# Patient Record
Sex: Female | Born: 1955 | Race: White | Hispanic: No | Marital: Married | Smoking: Current every day smoker
Health system: Southern US, Community
[De-identification: ages and names within clinical notes are randomized; demographics above are authoritative.]

## PROBLEM LIST (undated history)

## (undated) DIAGNOSIS — M159 Polyosteoarthritis, unspecified: Secondary | ICD-10-CM

## (undated) DIAGNOSIS — M10379 Gout due to renal impairment, unspecified ankle and foot: Secondary | ICD-10-CM

## (undated) DIAGNOSIS — E1122 Type 2 diabetes mellitus with diabetic chronic kidney disease: Secondary | ICD-10-CM

## (undated) DIAGNOSIS — G8929 Other chronic pain: Secondary | ICD-10-CM

## (undated) DIAGNOSIS — N182 Chronic kidney disease, stage 2 (mild): Secondary | ICD-10-CM

## (undated) DIAGNOSIS — F1990 Other psychoactive substance use, unspecified, uncomplicated: Secondary | ICD-10-CM

## (undated) DIAGNOSIS — I10 Essential (primary) hypertension: Secondary | ICD-10-CM

## (undated) DIAGNOSIS — F3181 Bipolar II disorder: Secondary | ICD-10-CM

## (undated) DIAGNOSIS — E785 Hyperlipidemia, unspecified: Secondary | ICD-10-CM

## (undated) DIAGNOSIS — M10271 Drug-induced gout, right ankle and foot: Secondary | ICD-10-CM

## (undated) DIAGNOSIS — M545 Low back pain, unspecified: Secondary | ICD-10-CM

## (undated) HISTORY — DX: Bipolar II disorder: F31.81

## (undated) HISTORY — DX: Drug-induced gout, right ankle and foot: M10.271

## (undated) HISTORY — DX: Gout due to renal impairment, unspecified ankle and foot: M10.379

## (undated) HISTORY — DX: Polyosteoarthritis, unspecified: M15.9

## (undated) HISTORY — DX: Chronic kidney disease, stage 2 (mild): N18.2

## (undated) HISTORY — DX: Essential (primary) hypertension: I10

## (undated) HISTORY — DX: Other psychoactive substance use, unspecified, uncomplicated: F19.90

## (undated) HISTORY — DX: Type 2 diabetes mellitus with diabetic chronic kidney disease: E11.22

## (undated) HISTORY — DX: Hyperlipidemia, unspecified: E78.5

## (undated) HISTORY — DX: Other chronic pain: G89.29

## (undated) HISTORY — DX: Low back pain, unspecified: M54.50

## (undated) HISTORY — DX: Low back pain: M54.5

---

## 1977-08-10 HISTORY — PX: ABDOMINAL HYSTERECTOMY: SHX81

## 1977-08-10 HISTORY — PX: OOPHORECTOMY: SHX86

## 1984-08-10 HISTORY — PX: BREAST SURGERY: SHX581

## 1998-03-20 ENCOUNTER — Inpatient Hospital Stay (HOSPITAL_COMMUNITY): Admission: EM | Admit: 1998-03-20 | Discharge: 1998-03-20 | Payer: Self-pay | Admitting: *Deleted

## 1998-04-01 ENCOUNTER — Emergency Department (HOSPITAL_COMMUNITY): Admission: EM | Admit: 1998-04-01 | Discharge: 1998-04-01 | Payer: Self-pay | Admitting: Emergency Medicine

## 1998-06-09 ENCOUNTER — Inpatient Hospital Stay (HOSPITAL_COMMUNITY): Admission: EM | Admit: 1998-06-09 | Discharge: 1998-06-12 | Payer: Self-pay | Admitting: Emergency Medicine

## 1998-06-09 ENCOUNTER — Emergency Department (HOSPITAL_COMMUNITY): Admission: EM | Admit: 1998-06-09 | Discharge: 1998-06-09 | Payer: Self-pay | Admitting: Emergency Medicine

## 1998-07-21 ENCOUNTER — Emergency Department (HOSPITAL_COMMUNITY): Admission: EM | Admit: 1998-07-21 | Discharge: 1998-07-21 | Payer: Self-pay | Admitting: Emergency Medicine

## 1998-10-12 ENCOUNTER — Emergency Department (HOSPITAL_COMMUNITY): Admission: EM | Admit: 1998-10-12 | Discharge: 1998-10-12 | Payer: Self-pay | Admitting: Emergency Medicine

## 1998-10-13 ENCOUNTER — Encounter: Payer: Self-pay | Admitting: Emergency Medicine

## 1998-11-11 ENCOUNTER — Encounter (HOSPITAL_COMMUNITY): Admission: RE | Admit: 1998-11-11 | Discharge: 1999-02-09 | Payer: Self-pay | Admitting: Psychiatry

## 1999-07-05 ENCOUNTER — Inpatient Hospital Stay (HOSPITAL_COMMUNITY): Admission: EM | Admit: 1999-07-05 | Discharge: 1999-07-06 | Payer: Self-pay | Admitting: Specialist

## 1999-07-05 ENCOUNTER — Inpatient Hospital Stay (HOSPITAL_COMMUNITY): Admission: EM | Admit: 1999-07-05 | Discharge: 1999-07-05 | Payer: Self-pay | Admitting: *Deleted

## 2000-08-06 ENCOUNTER — Other Ambulatory Visit (HOSPITAL_COMMUNITY): Admission: RE | Admit: 2000-08-06 | Discharge: 2000-09-06 | Payer: Self-pay | Admitting: Psychiatry

## 2000-10-16 ENCOUNTER — Emergency Department (HOSPITAL_COMMUNITY): Admission: EM | Admit: 2000-10-16 | Discharge: 2000-10-16 | Payer: Self-pay | Admitting: Internal Medicine

## 2003-10-07 ENCOUNTER — Emergency Department (HOSPITAL_COMMUNITY): Admission: EM | Admit: 2003-10-07 | Discharge: 2003-10-07 | Payer: Self-pay | Admitting: Emergency Medicine

## 2004-09-25 ENCOUNTER — Emergency Department (HOSPITAL_COMMUNITY): Admission: EM | Admit: 2004-09-25 | Discharge: 2004-09-25 | Payer: Self-pay | Admitting: Emergency Medicine

## 2005-04-23 ENCOUNTER — Other Ambulatory Visit (HOSPITAL_COMMUNITY): Admission: RE | Admit: 2005-04-23 | Discharge: 2005-05-29 | Payer: Self-pay | Admitting: Psychiatry

## 2005-04-23 ENCOUNTER — Ambulatory Visit: Payer: Self-pay | Admitting: Psychiatry

## 2006-08-09 ENCOUNTER — Inpatient Hospital Stay (HOSPITAL_COMMUNITY): Admission: AD | Admit: 2006-08-09 | Discharge: 2006-08-12 | Payer: Self-pay | Admitting: Psychiatry

## 2006-08-09 ENCOUNTER — Ambulatory Visit: Payer: Self-pay | Admitting: Psychiatry

## 2007-03-06 ENCOUNTER — Emergency Department (HOSPITAL_COMMUNITY): Admission: EM | Admit: 2007-03-06 | Discharge: 2007-03-06 | Payer: Self-pay | Admitting: Emergency Medicine

## 2007-03-11 ENCOUNTER — Encounter: Admission: RE | Admit: 2007-03-11 | Discharge: 2007-03-11 | Payer: Self-pay | Admitting: Internal Medicine

## 2007-03-16 ENCOUNTER — Encounter: Admission: RE | Admit: 2007-03-16 | Discharge: 2007-03-16 | Payer: Self-pay | Admitting: Internal Medicine

## 2007-03-16 ENCOUNTER — Encounter (INDEPENDENT_AMBULATORY_CARE_PROVIDER_SITE_OTHER): Payer: Self-pay | Admitting: Interventional Radiology

## 2007-03-16 ENCOUNTER — Other Ambulatory Visit: Admission: RE | Admit: 2007-03-16 | Discharge: 2007-03-16 | Payer: Self-pay | Admitting: Interventional Radiology

## 2010-11-24 ENCOUNTER — Other Ambulatory Visit: Payer: Self-pay | Admitting: Emergency Medicine

## 2010-11-24 DIAGNOSIS — Z01419 Encounter for gynecological examination (general) (routine) without abnormal findings: Secondary | ICD-10-CM | POA: Insufficient documentation

## 2010-11-24 LAB — HM PAP SMEAR: HM Pap smear: NORMAL

## 2010-11-26 ENCOUNTER — Other Ambulatory Visit (HOSPITAL_COMMUNITY)
Admission: RE | Admit: 2010-11-26 | Discharge: 2010-11-26 | Disposition: A | Discharge status: Home / Self Care | Payer: Medicare HMO | Source: Ambulatory Visit | Attending: Internal Medicine | Admitting: Internal Medicine
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
## 2013-07-29 ENCOUNTER — Other Ambulatory Visit: Payer: Self-pay | Admitting: Internal Medicine

## 2013-08-10 DIAGNOSIS — M545 Low back pain: Secondary | ICD-10-CM

## 2013-08-10 DIAGNOSIS — I10 Essential (primary) hypertension: Secondary | ICD-10-CM | POA: Insufficient documentation

## 2013-08-10 DIAGNOSIS — E119 Type 2 diabetes mellitus without complications: Secondary | ICD-10-CM | POA: Insufficient documentation

## 2013-08-10 DIAGNOSIS — G8929 Other chronic pain: Secondary | ICD-10-CM | POA: Insufficient documentation

## 2013-08-10 DIAGNOSIS — E785 Hyperlipidemia, unspecified: Secondary | ICD-10-CM | POA: Insufficient documentation

## 2013-08-10 DIAGNOSIS — M1990 Unspecified osteoarthritis, unspecified site: Secondary | ICD-10-CM | POA: Insufficient documentation

## 2013-08-14 ENCOUNTER — Ambulatory Visit: Payer: Self-pay | Admitting: Emergency Medicine

## 2013-08-30 ENCOUNTER — Encounter (HOSPITAL_COMMUNITY): Payer: Self-pay | Admitting: Emergency Medicine

## 2013-08-30 ENCOUNTER — Emergency Department (HOSPITAL_COMMUNITY)
Admission: EM | Admit: 2013-08-30 | Discharge: 2013-08-30 | Disposition: A | Discharge status: Home / Self Care | Payer: Medicare HMO | Attending: Emergency Medicine | Admitting: Emergency Medicine

## 2013-08-30 ENCOUNTER — Emergency Department (HOSPITAL_COMMUNITY): Payer: Medicare HMO

## 2013-08-30 DIAGNOSIS — J329 Chronic sinusitis, unspecified: Secondary | ICD-10-CM | POA: Insufficient documentation

## 2013-08-30 DIAGNOSIS — F17200 Nicotine dependence, unspecified, uncomplicated: Secondary | ICD-10-CM | POA: Insufficient documentation

## 2013-08-30 DIAGNOSIS — J40 Bronchitis, not specified as acute or chronic: Secondary | ICD-10-CM

## 2013-08-30 DIAGNOSIS — Z720 Tobacco use: Secondary | ICD-10-CM

## 2013-08-30 DIAGNOSIS — I10 Essential (primary) hypertension: Secondary | ICD-10-CM | POA: Insufficient documentation

## 2013-08-30 DIAGNOSIS — Z79899 Other long term (current) drug therapy: Secondary | ICD-10-CM | POA: Insufficient documentation

## 2013-08-30 DIAGNOSIS — J209 Acute bronchitis, unspecified: Secondary | ICD-10-CM | POA: Insufficient documentation

## 2013-08-30 DIAGNOSIS — E785 Hyperlipidemia, unspecified: Secondary | ICD-10-CM | POA: Insufficient documentation

## 2013-08-30 DIAGNOSIS — R000 Tachycardia, unspecified: Secondary | ICD-10-CM | POA: Insufficient documentation

## 2013-08-30 DIAGNOSIS — Z7982 Long term (current) use of aspirin: Secondary | ICD-10-CM | POA: Insufficient documentation

## 2013-08-30 DIAGNOSIS — M129 Arthropathy, unspecified: Secondary | ICD-10-CM | POA: Insufficient documentation

## 2013-08-30 DIAGNOSIS — G8929 Other chronic pain: Secondary | ICD-10-CM | POA: Insufficient documentation

## 2013-08-30 DIAGNOSIS — E119 Type 2 diabetes mellitus without complications: Secondary | ICD-10-CM | POA: Insufficient documentation

## 2013-08-30 MED ORDER — PREDNISONE 20 MG PO TABS: 20.0000 mg | ORAL_TABLET | Freq: Two times a day (BID) | ORAL | Status: DC

## 2013-08-30 MED ORDER — LEVOFLOXACIN 500 MG PO TABS
750.0000 mg | ORAL_TABLET | Freq: Every day | ORAL | Status: DC
  Administered 2013-08-30: 750 mg via ORAL
  Filled 2013-08-30: qty 2

## 2013-08-30 MED ORDER — ALBUTEROL SULFATE HFA 108 (90 BASE) MCG/ACT IN AERS
2.0000 | INHALATION_SPRAY | Freq: Once | RESPIRATORY_TRACT | Status: AC
  Administered 2013-08-30: 2 via RESPIRATORY_TRACT
  Filled 2013-08-30: qty 6.7

## 2013-08-30 MED ORDER — LEVOFLOXACIN 750 MG PO TABS: 750.0000 mg | ORAL_TABLET | Freq: Every day | ORAL | Status: DC

## 2013-08-30 MED ORDER — PREDNISONE 20 MG PO TABS
60.0000 mg | ORAL_TABLET | Freq: Once | ORAL | Status: AC
  Administered 2013-08-30: 60 mg via ORAL
  Filled 2013-08-30: qty 3

## 2013-08-30 MED ORDER — AEROCHAMBER Z-STAT PLUS/MEDIUM MISC
1.0000 | Freq: Once | Status: DC
  Filled 2013-08-30: qty 1

## 2013-08-30 MED ORDER — ALBUTEROL SULFATE HFA 108 (90 BASE) MCG/ACT IN AERS
INHALATION_SPRAY | RESPIRATORY_TRACT | Status: AC
  Filled 2013-08-30: qty 6.7

## 2013-08-30 MED ORDER — OPTICHAMBER ADVANTAGE MISC
1.0000 | Freq: Once | Status: AC
  Administered 2013-08-30: 1
  Filled 2013-08-30: qty 1

## 2013-08-30 NOTE — ED Notes (Signed)
Pt c/o feeling like flu symptoms; cough; headache; chest congestion; nasal congestion;

## 2013-08-30 NOTE — Discharge Instructions (Signed)
Use the inhaler, 2 puffs every 3 or 4 hours as needed for cough or trouble breathing.  Try to stop smoking. See the doctor of her choice for treatment if not better in one or 2 weeks. Use the resource guide, below, to help you find a doctor.     Bronchitis Bronchitis is inflammation of the airways that extend from the windpipe into the lungs (bronchi). The inflammation often causes mucus to develop, which leads to a cough. If the inflammation becomes severe, it may cause shortness of breath. CAUSES  Bronchitis may be caused by:   Viral infections.   Bacteria.   Cigarette smoke.   Allergens, pollutants, and other irritants.  SIGNS AND SYMPTOMS  The most common symptom of bronchitis is a frequent cough that produces mucus. Other symptoms include:  Fever.   Body aches.   Chest congestion.   Chills.   Shortness of breath.   Sore throat.  DIAGNOSIS  Bronchitis is usually diagnosed through a medical history and physical exam. Tests, such as chest X-rays, are sometimes done to rule out other conditions.  TREATMENT  You may need to avoid contact with whatever caused the problem (smoking, for example). Medicines are sometimes needed. These may include:  Antibiotics. These may be prescribed if the condition is caused by bacteria.  Cough suppressants. These may be prescribed for relief of cough symptoms.   Inhaled medicines. These may be prescribed to help open your airways and make it easier for you to breathe.   Steroid medicines. These may be prescribed for those with recurrent (chronic) bronchitis. HOME CARE INSTRUCTIONS  Get plenty of rest.   Drink enough fluids to keep your urine clear or pale yellow (unless you have a medical condition that requires fluid restriction). Increasing fluids may help thin your secretions and will prevent dehydration.   Only take over-the-counter or prescription medicines as directed by your health care provider.  Only  take antibiotics as directed. Make sure you finish them even if you start to feel better.  Avoid secondhand smoke, irritating chemicals, and strong fumes. These will make bronchitis worse. If you are a smoker, quit smoking. Consider using nicotine gum or skin patches to help control withdrawal symptoms. Quitting smoking will help your lungs heal faster.   Put a cool-mist humidifier in your bedroom at night to moisten the air. This may help loosen mucus. Change the water in the humidifier daily. You can also run the hot water in your shower and sit in the bathroom with the door closed for 5 10 minutes.   Follow up with your health care provider as directed.   Wash your hands frequently to avoid catching bronchitis again or spreading an infection to others.  SEEK MEDICAL CARE IF: Your symptoms do not improve after 1 week of treatment.  SEEK IMMEDIATE MEDICAL CARE IF:  Your fever increases.  You have chills.   You have chest pain.   You have worsening shortness of breath.   You have bloody sputum.  You faint.  You have lightheadedness.  You have a severe headache.   You vomit repeatedly. MAKE SURE YOU:   Understand these instructions.  Will watch your condition.  Will get help right away if you are not doing well or get worse. Document Released: 07/27/2005 Document Revised: 05/17/2013 Document Reviewed: 03/21/2013 [facility] Patient Information 2014 [place], [place].  Sinusitis Sinusitis is redness, soreness, and swelling (inflammation) of the paranasal sinuses. Paranasal sinuses are air pockets within the bones of your  face (beneath the eyes, the middle of the forehead, or above the eyes). In healthy paranasal sinuses, mucus is able to drain out, and air is able to circulate through them by way of your nose. However, when your paranasal sinuses are inflamed, mucus and air can become trapped. This can allow bacteria and other germs to grow and cause infection. Sinusitis  can develop quickly and last only a short time (acute) or continue over a long period (chronic). Sinusitis that lasts for more than 12 weeks is considered chronic.  CAUSES  Causes of sinusitis include:  Allergies.  Structural abnormalities, such as displacement of the cartilage that separates your nostrils (deviated septum), which can decrease the air flow through your nose and sinuses and affect sinus drainage.  Functional abnormalities, such as when the small hairs (cilia) that line your sinuses and help remove mucus do not work properly or are not present. SYMPTOMS  Symptoms of acute and chronic sinusitis are the same. The primary symptoms are pain and pressure around the affected sinuses. Other symptoms include:  Upper toothache.  Earache.  Headache.  Bad breath.  Decreased sense of smell and taste.  A cough, which worsens when you are lying flat.  Fatigue.  Fever.  Thick drainage from your nose, which often is green and may contain pus (purulent).  Swelling and warmth over the affected sinuses. DIAGNOSIS  Your caregiver will perform a physical exam. During the exam, your caregiver may:  Look in your nose for signs of abnormal growths in your nostrils (nasal polyps).  Tap over the affected sinus to check for signs of infection.  View the inside of your sinuses (endoscopy) with a special imaging device with a light attached (endoscope), which is inserted into your sinuses. If your caregiver suspects that you have chronic sinusitis, one or more of the following tests may be recommended:  Allergy tests.  Nasal culture A sample of mucus is taken from your nose and sent to a lab and screened for bacteria.  Nasal cytology A sample of mucus is taken from your nose and examined by your caregiver to determine if your sinusitis is related to an allergy. TREATMENT  Most cases of acute sinusitis are related to a viral infection and will resolve on their own within 10 days.  Sometimes medicines are prescribed to help relieve symptoms (pain medicine, decongestants, nasal steroid sprays, or saline sprays).  However, for sinusitis related to a bacterial infection, your caregiver will prescribe antibiotic medicines. These are medicines that will help kill the bacteria causing the infection.  Rarely, sinusitis is caused by a fungal infection. In theses cases, your caregiver will prescribe antifungal medicine. For some cases of chronic sinusitis, surgery is needed. Generally, these are cases in which sinusitis recurs more than 3 times per year, despite other treatments. HOME CARE INSTRUCTIONS   Drink plenty of water. Water helps thin the mucus so your sinuses can drain more easily.  Use a humidifier.  Inhale steam 3 to 4 times a day (for example, sit in the bathroom with the shower running).  Apply a warm, moist washcloth to your face 3 to 4 times a day, or as directed by your caregiver.  Use saline nasal sprays to help moisten and clean your sinuses.  Take over-the-counter or prescription medicines for pain, discomfort, or fever only as directed by your caregiver. SEEK IMMEDIATE MEDICAL CARE IF:  You have increasing pain or severe headaches.  You have nausea, vomiting, or drowsiness.  You have swelling  around your face.  You have vision problems.  You have a stiff neck.  You have difficulty breathing. MAKE SURE YOU:   Understand these instructions.  Will watch your condition.  Will get help right away if you are not doing well or get worse. Document Released: 07/27/2005 Document Revised: 10/19/2011 Document Reviewed: 08/11/2011 [facility] Patient Information 2014 [place], [place].  Smoking Hazards Smoking cigarettes is extremely bad for your health. Tobacco smoke has over 200 known poisons in it. It contains the poisonous gases nitrogen oxide and carbon monoxide. There are over 60 chemicals in tobacco smoke that cause cancer. Some of the chemicals found  in cigarette smoke include:   Cyanide.   Benzene.   Formaldehyde.   Methanol (wood alcohol).   Acetylene (fuel used in welding torches).   Ammonia.  Even smoking lightly shortens your life expectancy by several years. You can greatly reduce the risk of medical problems for you and your family by stopping now. Smoking is the most preventable cause of death and disease in our society. Within days of quitting smoking, your circulation improves, you decrease the risk of having a heart attack, and your lung capacity improves. There may be some increased phlegm in the first few days after quitting, and it may take months for your lungs to clear up completely. Quitting for 10 years reduces your risk of developing lung cancer to almost that of a nonsmoker.  WHAT ARE THE RISKS OF SMOKING? Cigarette smokers have an increased risk of many serious medical problems, including:  Lung cancer.   Lung disease (such as pneumonia, bronchitis, and emphysema).   Heart attack and chest pain due to the heart not getting enough oxygen (angina).   Heart disease and peripheral blood vessel disease.   Hypertension.   Stroke.   Oral cancer (cancer of the lip, mouth, or voice box).   Bladder cancer.   Pancreatic cancer.   Cervical cancer.   Pregnancy complications, including premature birth.   Stillbirths and smaller newborn babies, birth defects, and genetic damage to sperm.   Early menopause.   Lower estrogen level for women.   Infertility.   Facial wrinkles.   Blindness.   Increased risk of broken bones (fractures).   Senile dementia.   Stomach ulcers and internal bleeding.   Delayed wound healing and increased risk of complications during surgery. Because of secondhand smoke exposure, children of smokers have an increased risk of the following:   Sudden infant death syndrome (SIDS).   Respiratory infections.   Lung cancer.   Heart disease.    Ear infections.  WHY IS SMOKING ADDICTIVE? Nicotine is the chemical agent in tobacco that is capable of causing addiction or dependence. When you smoke and inhale, nicotine is absorbed rapidly into the bloodstream through your lungs. Both inhaled and noninhaled nicotine may be addictive.  WHAT ARE THE BENEFITS OF QUITTING?  There are many health benefits to quitting smoking. Some are:   The likelihood of developing cancer and heart disease decreases. Health improvements are seen almost immediately.   Blood pressure, pulse rate, and breathing patterns start returning to normal soon after quitting.   People who quit may see an improvement in their overall quality of life.  HOW DO YOU QUIT SMOKING? Smoking is an addiction with both physical and psychological effects, and longtime habits can be hard to change. Your health care provider can recommend:  Programs and community resources, which may include group support, education, or therapy.  Replacement products, such as patches,  gum, and nasal sprays. Use these products only as directed. Do not replace cigarette smoking with electronic cigarettes (commonly called e-cigarettes). The safety of e-cigarettes is unknown, and some may contain harmful chemicals. FOR MORE INFORMATION  American Lung Association: www.lung.org  American Cancer Society: www.cancer.org Document Released: 09/03/2004 Document Revised: 05/17/2013 Document Reviewed: 01/16/2013 [facility] Patient Information 2014 [place], [place].    Emergency Department Resource Guide 1) Find a Doctor and Pay Out of Pocket Although you won't have to find out who is covered by your insurance plan, it is a good idea to ask around and get recommendations. You will then need to call the office and see if the doctor you have chosen will accept you as a new patient and what types of options they offer for patients who are self-pay. Some doctors offer discounts or will set up payment plans  for their patients who do not have insurance, but you will need to ask so you aren't surprised when you get to your appointment.  2) Contact Your Local Health Department Not all health departments have doctors that can see patients for sick visits, but many do, so it is worth a call to see if yours does. If you don't know where your local health department is, you can check in your phone book. The CDC also has a tool to help you locate your state's health department, and many state websites also have listings of all of their local health departments.  3) Find a Walk-in Clinic If your illness is not likely to be very severe or complicated, you may want to try a walk in clinic. These are popping up all over the country in pharmacies, drugstores, and shopping centers. They're usually staffed by nurse practitioners or physician assistants that have been trained to treat common illnesses and complaints. They're usually fairly quick and inexpensive. However, if you have serious medical issues or chronic medical problems, these are probably not your best option.  No Primary Care Doctor: - Call Health Connect at  [phone] - they can help you locate a primary care doctor that  accepts your insurance, provides certain services, etc. - Physician Referral Service- [phone]  Chronic Pain Problems: Organization         Address  Phone   Notes  [facility]  [phone] Patients need to be referred by their primary care doctor.   Medication Assistance: Organization         Address  Phone   Notes  [facility] Medication [facility] [address] [place], [place] [address] [phone] --Must be a resident of [facility] -- Must have NO insurance coverage whatsoever (no Medicaid/ Medicare, etc.) -- The pt. MUST have a primary care doctor that directs their care regularly and follows them in the community   [facility]  [phone]   [employer]  [phone]    Agencies that provide inexpensive medical care: Organization         Address  Phone   Notes  [facility]  [phone]   [facility]    [phone]   [facility] [address] [place], [place] [address] [phone]   [facility] [address] [place] [phone]   Planned Parenthood    [phone]   [facility]    [phone]   [facility]  [address] [place], [place]  Phone:  [phone], Fax:  [phone] Hours of Operation:  9 am - 6 pm, M-F.  Also accepts Medicaid/Medicare and self-pay.  [facility]  [address], [place] Phone: [phone], Fax: [phone]. Hours of Operation:  8:30 am - 5:30 pm, M-F.  Also accepts Medicaid and self-pay.  [facility] [place] [address] [place] Phone: [phone]   [facility] [address] Natasha Bence [place], [place] [phone], Ext. [phone] Mondays & Thursdays: 7-9 AM.  First 15 patients are seen on a first come, first serve basis.    Medicaid-accepting [facility] Providers:  Organization         Address  Phone   Notes  [facility] [address], [place] [phone] Also accepts self-pay patients.  [facility] [address] Laurell Josephs [place], [place]  [phone]   [facility] [address], [place] [phone]   [facility] [address], [place] [phone]   Renaye Rakers [address], [place]   [phone] Only accepts [place] Access [place] patients after they have their name applied to their card.   Self-Pay (no insurance) in [facility]:  Organization         Address  Phone   Notes  Sickle Cell Patients, [facility] [address] [place], [place] [phone]   [facility] [address] [place], [place] [phone]   [facility]  [address], [place] [phone]   [facility]/Dr. Osei-Bonsu  [address], [place] or [address], [place] [phone] Phone number for both [place] and [place] locations is the same.  [facility] [address], [place] [phone]   [facility] [address], [place] or [address] [phone]   [facility] [address], [place] [phone], phone; [phone], fax Sees patients 1st and 3rd Saturday of every month.  Must not qualify for public or private insurance (i.e. Medicaid, Medicare, [place] Health Choice, Veterans' Benefits)  Household income should be no more than 200% of the poverty level The clinic cannot treat you if you are pregnant or think you are pregnant  Sexually transmitted diseases are not treated at the clinic.    Dental Care: Organization         Address  Phone  Notes  [facility] Department of [facility] [address] [place], [place] [phone] Accepts children up to age 43 who are enrolled in [place] or [place] Health Choice; pregnant women with a Medicaid card; and children who have applied for Medicaid or [place] Health Choice, but were declined, whose parents can pay a reduced fee at time of service.  [facility] Department of [facility]  [address], [place] [phone] Accepts children up to age 21 who are enrolled in [place] or [place] Health Choice; pregnant women with a Medicaid card; and children who have applied for Medicaid or [place] Health Choice, but were declined, whose parents can pay a reduced fee at time of service.  [facility]  [address] [place], [place] [phone] Patients are  seen by appointment  only. Walk-ins are not accepted. [facility] will see patients 44 years of age and older. Monday - Tuesday (8am-5pm) Most Wednesdays (8:30-5pm) $30 per visit, cash only  [facility] Adult Dental Access PROGRAM  [address], [facility] [phone] Patients are seen by appointment only. Walk-ins are not accepted. [facility] will see patients 41 years of age and older. One Wednesday Evening (Monthly: Volunteer Based).  $30 per visit, cash only  [employer]  [phone] for adults; Children under age 62, call Graduate Pediatric Dentistry at [phone]. Children aged 56-14, please call [phone] to request a pediatric application.  Dental services are provided in all areas of dental care including fillings, crowns and bridges, complete and partial dentures, implants, gum treatment, root canals, and extractions. Preventive care is also provided. Treatment is provided to both adults and children. Patients are selected via a lottery and there is often a waiting list.   [facility] [address], [place]  [phone] [email or web address]   [facility] [address] [place], [place] [phone], Ext. [phone] Second and Fourth Thursday of each month, opens at 6:30 AM; Clinic ends at 9 AM.  Patients are seen on a first-come first-served basis, and a limited number are seen during each clinic.   [facility]  [address] Ether Griffins [place], [place] [phone]   Eligibility Requirements You must have lived in [place], [place], or [place] counties for at least the last three months.   You cannot be eligible for state or federal sponsored [place], including [employer], [place], or [employer].   You generally cannot be eligible for healthcare insurance through your employer.    How to apply: Eligibility screenings are held every Tuesday and Wednesday afternoon from 1:00 pm until 4:00  pm. You do not need an appointment for the interview!  [facility] [address], [place], [place] [phone]   [facility] Health Department  [phone]   [facility] Health Department  [phone]   [facility] Health Department  [phone]    Behavioral Health Resources in the Community: Intensive Outpatient Programs Organization         Address  Phone  Notes  [facility] Services [address], [place], [place] [phone]   [facility] Outpatient [address] [place], [place] [phone]   [facility] [address], [place], [place]  [phone]   [facility] Mental Health [address]  [place], [place] [phone] or [phone]   Substance Abuse Resources Organization         Address  Phone  Notes  [facility]  [phone]   [facility]  [phone]   [facility]  [phone]   Floydene Flock  [phone]   Residential & Outpatient Substance Abuse Program  [phone]   Psychological Services Organization         Address  Phone  Notes  [facility] Behavioral Health  [phone]   [facility] Services  [phone]   [facility] Mental Health [address] [place] [phone] or [phone]    Mobile Crisis Teams Organization         Address  Phone  Notes  [facility], Mobile Crisis Care Unit  [phone]   [facility]  [address] [place], [place] [phone]   [facility] [address] [place] [address] [place] [phone]  Self-Help/Support Groups Organization         Address  Phone             Notes  [facility] [place] - variety of support groups  336- I7437963 Call for more information  Narcotics Anonymous (NA), [facility] [address], [employer] [place]  2 meetings at this location   Statistician          Address  Phone  Notes  [facility] [address],    [place]  [phone]   [facility]  [address] [place] [address], [place], [place] [phone]   [facility] [address] [place], [place] [phone] Admissions: 8am-3pm M-F  [facility] [address],    [place], [place] [phone]   [facility] [address] [place], [place], [place] [phone]   [facility] [address],  [place], [place] [phone]   [facility] - Intensive Outpatient [address] Laurell Josephs [address], [place], [place] [phone]   [facility] ([facility] [address] [place]  [place], [place] [phone] or [phone]   [facility] ([facility]) [address] [place], [place] [phone] Accepts Medicaid  [facility] [place] [address]  [place] [phone] Substance Abuse/Addiction Treatment   [facility] Organization         Address  Phone  Notes  [facility]  [phone]   Angie Fava, PhD [address] Ervin Knack [place], [place]   [phone] or [phone]   [facility]al   [address] [place], [place] [phone]   [facility] [address], [place], [place] [phone] Insurance/Medicaid/sponsorship through [facility] and Families [address]                                    [place], [place] [phone] Therapy/tele-psych/case  [facility] [address], [place] [phone]    Dr. Lolly Mustache  [phone]   [facility]  [employer] [facility] Dept. 1) [address] [place] 2) [address] [place] 3)  [address], [place] [phone]  [phone]   [facility] Child Abuse Hotline [phone] or [phone] (After Hours)

## 2013-08-30 NOTE — ED Notes (Signed)
Patient transported to X-ray 

## 2013-08-30 NOTE — ED Provider Notes (Signed)
CSN: [id number]     Arrival date & time 08/30/13  4098 History   First MD Initiated Contact with Patient 08/30/13 1015     Chief Complaint  Patient presents with  . Cough   (Consider location/radiation/quality/duration/timing/severity/associated sxs/prior Treatment) Patient is a 58 y.o. female presenting with cough. The history is provided by the patient.  Cough Allison [name] is a 58 y.o. female who complains of nasal congestion, cough, sputum production, and shortness of breath for 2 weeks . Allison [name] has had to cut down on her cigarette smoking because of the discomfort. Allison [name] is using over-the-counter medications, without relief. Allison [name] denies fever, chills, chest pain, weakness, or dizziness. There are no known sick contacts.  Past Medical History  Diagnosis Date  . Hypertension   . Hyperlipidemia   . Type II or unspecified type diabetes mellitus without mention of complication, not stated as uncontrolled   . Arthritis   . Chronic lumbar pain    Past Surgical History  Procedure Laterality Date  . Abdominal hysterectomy  1979  . Oophorectomy  1979  . Breast surgery Bilateral 1986    gel implants    No family history on file. History  Substance Use Topics  . Smoking status: Current Every Day Smoker -- 1.00 packs/day    Types: Cigarettes  . Smokeless tobacco: Not on file  . Alcohol Use: Not on file   OB History   Grav Para Term Preterm Abortions TAB SAB Ect Mult Living                 Review of Systems  Respiratory: Positive for cough.   All other systems reviewed and are negative.    Allergies  Review of patient's allergies indicates no known allergies.  Home Medications   Current Outpatient Rx  Name  Route  Sig  Dispense  Refill  . ALPRAZolam (XANAX) 0.5 MG tablet   Oral   Take 0.5 mg by mouth 3 (three) times daily as needed for anxiety.         Marland Kitchen aspirin 81 MG chewable tablet   Oral   Chew by mouth daily.         . bisoprolol-hydrochlorothiazide (ZIAC) 5-6.25  MG per tablet   Oral   Take 1 tablet by mouth daily.         . Cholecalciferol (VITAMIN D PO)   Oral   Take 8,000 Int'l Units by mouth daily.         . Dapagliflozin Propanediol (FARXIGA) 10 MG TABS   Oral   Take 10 mg by mouth daily.         Marland Kitchen guaiFENesin (MUCINEX) 600 MG 12 hr tablet   Oral   Take 600 mg by mouth 2 (two) times daily as needed for to loosen phlegm.         . metFORMIN (GLUCOPHAGE-XR) 500 MG 24 hr tablet   Oral   Take 500 mg by mouth 2 (two) times daily at 10 AM and 5 PM.         . OVER THE COUNTER MEDICATION   Oral   Take 1 tablet by mouth every 4 (four) hours as needed (congestion).         . pravastatin (PRAVACHOL) 40 MG tablet   Oral   Take 40 mg by mouth daily.         . Pseudoeph-Doxylamine-DM-APAP (NYQUIL PO)   Oral   Take 30 mLs by mouth every 4 (four) hours as needed (flu-like symptoms).         Marland Kitchen  pseudoephedrine (SUDAFED) 30 MG tablet   Oral   Take 30 mg by mouth every 4 (four) hours as needed for congestion.         . QUEtiapine Fumarate (SEROQUEL PO)   Oral   Take by mouth. Takes 50 mg a.m. And 300 mg p.m.         Marland Kitchen. levofloxacin (LEVAQUIN) 750 MG tablet   Oral   Take 1 tablet (750 mg total) by mouth daily. X 7 days   7 tablet   0   . predniSONE (DELTASONE) 20 MG tablet   Oral   Take 1 tablet (20 mg total) by mouth 2 (two) times daily.   10 tablet   0    BP 109/72  Pulse 117  Temp(Src) 99.3 F (37.4 C) (Oral)  Resp 17  SpO2 95% Physical Exam  Nursing note and vitals reviewed. Constitutional: Allison [name] is oriented to person, place, and time. Allison [name] appears well-developed and well-nourished.  HENT:  Head: Normocephalic and atraumatic.  Eyes: Conjunctivae and EOM are normal. Pupils are equal, round, and reactive to light.  Neck: Normal range of motion and phonation normal. Neck supple.  Cardiovascular: Regular rhythm and intact distal pulses.   Tachycardic  Pulmonary/Chest: Effort normal. Allison [name] exhibits no  tenderness.  Decreased air movement bilaterally with scattered rhonchi and wheezing  Abdominal: Soft. Allison [name] exhibits no distension. There is no tenderness. There is no guarding.  Musculoskeletal: Normal range of motion.  Neurological: Allison [name] is alert and oriented to person, place, and time. Allison [name] exhibits normal muscle tone.  Skin: Skin is warm and dry.  Psychiatric: Allison [name] has a normal mood and affect. Her behavior is normal. Judgment and thought content normal.    ED Course  Procedures (including critical care time) Medications  predniSONE (DELTASONE) tablet 60 mg (60 mg Oral Given 08/30/13 1109)  albuterol (PROVENTIL HFA;VENTOLIN HFA) 108 (90 BASE) MCG/ACT inhaler 2 puff (2 puffs Inhalation Given 08/30/13 1103)  OPTICHAMBER ADVANTAGE MISC 1 each (1 each Other Given 08/30/13 1117)    Patient Vitals for the past 24 hrs:  BP Temp Temp src Pulse Resp SpO2  08/30/13 1153 109/72 mmHg 99.3 F (37.4 C) Oral 117 17 95 %  08/30/13 1103 - - - - - 95 %  08/30/13 1008 94/72 mmHg 98.2 F (36.8 C) - 135 20 96 %        Labs Review Labs Reviewed - No data to display Imaging Review Dg Chest 2 View  08/30/2013   CLINICAL DATA:  Productive cough  EXAM: CHEST  2 VIEW  COMPARISON:  03/06/2007  FINDINGS: Cardiomediastinal silhouette is stable. No acute infiltrate or pleural effusion. No pulmonary edema. Linear atelectasis noted bilateral basilar. Mild degenerative changes lower thoracic spine.  IMPRESSION: No acute infiltrate or pulmonary edema. Bilateral basilar linear atelectasis.   Electronically Signed   By: Natasha MeadLiviu  Pop M.D.   On: 08/30/2013 11:30    EKG Interpretation   None       MDM   1. Sinusitis   2. Bronchitis   3. Tobacco abuse    Evaluation is consistent tobacco-related bronchitis. No evidence for pneumonia, PE, or respiratory distress.  Nursing Notes Reviewed/ Care Coordinated Applicable Imaging Reviewed Interpretation of Laboratory Data incorporated into ED treatment  The patient  appears reasonably screened and/or stabilized for discharge and I doubt any other medical condition or other [facility] requiring further screening, evaluation, or treatment in the ED at this time prior to discharge.  Plan: Home Medications- Levaquin, Prednisone, Albuterol;  Home Treatments- Stop smoking; return here if the recommended treatment, does not improve the symptoms; Recommended follow up- PCP of choice 1-2 weeks    Flint Melter, MD 08/30/13 1711

## 2013-11-02 ENCOUNTER — Other Ambulatory Visit: Payer: Self-pay | Admitting: Internal Medicine

## 2013-11-02 ENCOUNTER — Encounter: Payer: Self-pay | Admitting: Internal Medicine

## 2013-11-02 ENCOUNTER — Ambulatory Visit (INDEPENDENT_AMBULATORY_CARE_PROVIDER_SITE_OTHER): Payer: Medicare HMO | Admitting: Internal Medicine

## 2013-11-02 VITALS — BP 110/74 | HR 60 | Temp 97.7°F | Resp 16 | Ht 65.0 in | Wt 173.2 lb

## 2013-11-02 DIAGNOSIS — Z789 Other specified health status: Secondary | ICD-10-CM

## 2013-11-02 DIAGNOSIS — E559 Vitamin D deficiency, unspecified: Secondary | ICD-10-CM | POA: Insufficient documentation

## 2013-11-02 DIAGNOSIS — R7401 Elevation of levels of liver transaminase levels: Secondary | ICD-10-CM

## 2013-11-02 DIAGNOSIS — Z1212 Encounter for screening for malignant neoplasm of rectum: Secondary | ICD-10-CM

## 2013-11-02 DIAGNOSIS — E119 Type 2 diabetes mellitus without complications: Secondary | ICD-10-CM

## 2013-11-02 DIAGNOSIS — I10 Essential (primary) hypertension: Secondary | ICD-10-CM

## 2013-11-02 DIAGNOSIS — F3162 Bipolar disorder, current episode mixed, moderate: Secondary | ICD-10-CM | POA: Insufficient documentation

## 2013-11-02 DIAGNOSIS — E785 Hyperlipidemia, unspecified: Secondary | ICD-10-CM

## 2013-11-02 DIAGNOSIS — Z1331 Encounter for screening for depression: Secondary | ICD-10-CM

## 2013-11-02 DIAGNOSIS — Z79899 Other long term (current) drug therapy: Secondary | ICD-10-CM

## 2013-11-02 DIAGNOSIS — Z113 Encounter for screening for infections with a predominantly sexual mode of transmission: Secondary | ICD-10-CM

## 2013-11-02 DIAGNOSIS — R740 Nonspecific elevation of levels of transaminase and lactic acid dehydrogenase [LDH]: Secondary | ICD-10-CM

## 2013-11-02 LAB — CBC WITH DIFFERENTIAL/PLATELET
BASOS ABS: 0 10*3/uL (ref 0.0–0.1)
Basophils Relative: 0 % (ref 0–1)
EOS PCT: 1 % (ref 0–5)
Eosinophils Absolute: 0.1 10*3/uL (ref 0.0–0.7)
HEMATOCRIT: 44.8 % (ref 36.0–46.0)
Hemoglobin: 15.2 g/dL — ABNORMAL HIGH (ref 12.0–15.0)
Lymphocytes Relative: 27 % (ref 12–46)
Lymphs Abs: 2.9 10*3/uL (ref 0.7–4.0)
MCH: 29.2 pg (ref 26.0–34.0)
MCHC: 33.9 g/dL (ref 30.0–36.0)
MCV: 86 fL (ref 78.0–100.0)
MONO ABS: 0.6 10*3/uL (ref 0.1–1.0)
Monocytes Relative: 6 % (ref 3–12)
Neutro Abs: 7 10*3/uL (ref 1.7–7.7)
Neutrophils Relative %: 66 % (ref 43–77)
Platelets: 257 10*3/uL (ref 150–400)
RBC: 5.21 MIL/uL — ABNORMAL HIGH (ref 3.87–5.11)
RDW: 13.6 % (ref 11.5–15.5)
WBC: 10.6 10*3/uL — ABNORMAL HIGH (ref 4.0–10.5)

## 2013-11-02 MED ORDER — DOXYCYCLINE HYCLATE 100 MG PO CAPS: ORAL_CAPSULE | ORAL | Status: DC

## 2013-11-02 MED ORDER — FLUCONAZOLE 150 MG PO TABS: ORAL_TABLET | ORAL | Status: DC

## 2013-11-02 NOTE — Progress Notes (Signed)
Patient ID: Allison GuildSheri Stolz, female   DOB: 02/15/1956, 58 y.o.   MRN: [id number]   Annual Screening Comprehensive Examination  This very nice 58 y.o. SWF presents for complete physical.  Patient has been followed for HTN, T2 NIDDM, Hyperlipidemia, and Vitamin D Deficiency.    HTN predates since 1998. Patient's BP has been controlled at home. Today's BP: 110/74 mmHg. Patient denies any cardiac symptoms as chest pain, palpitations, shortness of breath, dizziness or ankle swelling.   Patient's hyperlipidemia is controlled with diet and medications. Patient denies myalgias or other medication SE's. Last cholesterol last visit was 136, triglycerides 195, HDL 42 and LDL 55 in Oct 2014 - at goal.    Patient has Bipolar MDD (type III) Dx'd in 2008 and she has been on SS Disability since that time. [facility] - but cannot remember the name of her new psychiatrist.   Patient has T2 NIDDM since Sept 2013 and felt due to weight gain from Seroquel and with last A1c 10.5% in Oct 2014. Currently she checks her sugars infrequently. Patient denies reactive hypoglycemic symptoms, visual blurring, diabetic polys, or paresthesias.   Finally, patient has history of Vitamin D Deficiency of 26 in 2012 with last vitamin D 42 in Oct 2014.  Medication Sig  . ALPRAZolam (XANAX) 0.5 MG tablet Take 0.5 mg by mouth 3 (three) times daily as needed for anxiety.  Marland Kitchen. aspirin 81 MG chewable tablet Chew by mouth daily.  . bisoprolol-hydrochlorothiazide  5-6.25 MG  Take 1 tablet by mouth daily.  . Cholecalciferol (VITAMIN D PO) Take 8,000 Int'l Units by mouth daily.  . Dapagliflozin Propanediol (FARXIGA) 10 MG TABS Take 10 mg by mouth daily.  . metFORMIN (GLUCOPHAGE-XR) 500 MG 24 hr tablet Take 500 mg by mouth 2 (two) times daily at 10 AM and 5 PM.  . pravastatin (PRAVACHOL) 40 MG tablet Take 40 mg by mouth daily.  . QUEtiapine Fumarate (SEROQUEL PO) Take by mouth. Takes 50 mg a.m. And 300 mg p.m.    No Known Allergies  Past Medical History  Diagnosis Date  . Hypertension   . Hyperlipidemia   . Type II or unspecified type diabetes mellitus without mention of complication, not stated as uncontrolled   . Arthritis   . Chronic lumbar pain     Past Surgical History  Procedure Laterality Date  . Abdominal hysterectomy  1979  . Oophorectomy  1979  . Breast surgery Bilateral 1986    gel implants    family history (+) for HTN, ASHD,  DM & MS in parents and sibs.  History  Substance Use Topics  . Smoking status: Current Every Day Smoker -- 1.00 ppd x 18 yrs    Types: Cigarettes  . Smokeless tobacco:   . Alcohol Use: No    ROS Constitutional: Denies fever, chills, weight loss/gain, headaches, insomnia, fatigue, night sweats, and change in appetite. Eyes: Denies redness, blurred vision, diplopia, discharge, itchy, watery eyes.  ENT: Denies discharge, congestion, post nasal drip, epistaxis, sore throat, earache, hearing loss, dental pain, Tinnitus, Vertigo, Sinus pain, snoring.  Cardio: Denies chest pain, palpitations, irregular heartbeat, syncope, dyspnea, diaphoresis, orthopnea, PND, claudication, edema Respiratory: denies cough, dyspnea, DOE, pleurisy, hoarseness, laryngitis, wheezing.  Gastrointestinal: Denies dysphagia, heartburn, reflux, water brash, pain, cramps, nausea, vomiting, bloating, diarrhea, constipation, hematemesis, melena, hematochezia, jaundice, hemorrhoids Genitourinary: Denies dysuria, frequency, urgency, nocturia, hesitancy, discharge, hematuria, flank pain Breast: . No nipple discharge, bleeding.  Musculoskeletal: Denies arthralgia, myalgia, stiffness, Jt. Swelling,  pain, limp, and strain/sprain. Skin: Denies puritis, rash, hives, warts, acne, eczema, changing in skin lesion Neuro: No weakness, tremor, incoordination, spasms, paresthesia, pain Psychiatric: Denies confusion, memory loss, sensory loss Endocrine: Denies change in weight, skin, hair change,  nocturia, and paresthesia, diabetic polys, visual blurring, hyper / hypo glycemic episodes.  Heme/Lymph: No excessive bleeding, bruising, enlarged lymph nodes.   Physical Exam  BP 110/74  Pulse 60  Temp(Src) 97.7 F (36.5 C) (Temporal)  Resp 16  Ht 5\' 5"  (1.651 m)  Wt 173 lb 3.2 oz (78.563 kg)  BMI 28.82 kg/m2  General Appearance: Well nourished, in no apparent distress. Eyes: PERRLA, EOMs, conjunctiva no swelling or erythema, normal fundi and vessels. Sinuses: No frontal/maxillary tenderness ENT/Mouth: EACs patent / TMs  nl. Nares clear without erythema, swelling, mucoid exudates. Oral hygiene is good. No erythema, swelling, or exudate. Tongue normal, non-obstructing. Tonsils not swollen or erythematous. Hearing normal.  Neck: Supple, thyroid normal. No bruits, nodes or JVD. Respiratory: Respiratory effort normal.  BS equal and clear bilateral without rales, rhonci, wheezing or stridor. Cardio: Heart sounds are normal with regular rate and rhythm and no murmurs, rubs or gallops. Peripheral pulses are normal and equal bilaterally without edema. No aortic or femoral bruits. Chest: symmetric with normal excursions and percussion. Breasts: Symmetric,Palpable implants w/o abn. Masses detected and without nipple discharge, retractions, or fibrocystic changes.  Abdomen: Flat, soft, with bowl sounds. Nontender, no guarding, rebound, hernias, masses, or organomegaly.  Lymphatics: Non tender without lymphadenopathy.  Musculoskeletal: Full ROM all peripheral extremities, joint stability, 5/5 strength, and normal gait. Skin: Warm and dry without rashes, lesions, cyanosis, clubbing or  ecchymosis.  Neuro: Cranial nerves intact, reflexes equal bilaterally. Normal muscle tone, no cerebellar symptoms. Sensation intact.  Pysch: Awake and oriented X 3, normal affect, Insight and Judgment appropriate.   Assessment and Plan  1. Annual Screening Examination 2. Hypertension  3. Hyperlipidemia 4. T2  NIDDM 5. Vitamin D Deficiency 6. Bipolar Manic Depressive Disorder  Continue prudent diet as discussed, weight control, BP monitoring, regular exercise, and medications. Discussed med's effects and SE's. Screening labs and tests as requested with regular follow-up as recommended.

## 2013-11-02 NOTE — Patient Instructions (Signed)

## 2013-11-03 ENCOUNTER — Telehealth: Payer: Self-pay

## 2013-11-03 ENCOUNTER — Other Ambulatory Visit: Payer: Self-pay | Admitting: Internal Medicine

## 2013-11-03 LAB — HEPATITIS C ANTIBODY: HCV AB: NEGATIVE

## 2013-11-03 LAB — URINALYSIS, MICROSCOPIC ONLY
CASTS: NONE SEEN
CRYSTALS: NONE SEEN

## 2013-11-03 LAB — RPR

## 2013-11-03 LAB — BASIC METABOLIC PANEL WITH GFR
BUN: 20 mg/dL (ref 6–23)
CALCIUM: 10.1 mg/dL (ref 8.4–10.5)
CO2: 30 mEq/L (ref 19–32)
CREATININE: 1.17 mg/dL — AB (ref 0.50–1.10)
Chloride: 87 mEq/L — ABNORMAL LOW (ref 96–112)
GFR, EST NON AFRICAN AMERICAN: 52 mL/min — AB
GFR, Est African American: 60 mL/min
Glucose, Bld: 526 mg/dL (ref 70–99)
Potassium: 4.1 mEq/L (ref 3.5–5.3)
Sodium: 129 mEq/L — ABNORMAL LOW (ref 135–145)

## 2013-11-03 LAB — MICROALBUMIN / CREATININE URINE RATIO
Creatinine, Urine: 28.1 mg/dL
MICROALB/CREAT RATIO: 17.8 mg/g (ref 0.0–30.0)
Microalb, Ur: 0.5 mg/dL (ref 0.00–1.89)

## 2013-11-03 LAB — HEMOGLOBIN A1C
Hgb A1c MFr Bld: 15.9 % — ABNORMAL HIGH (ref <5.7)
MEAN PLASMA GLUCOSE: 410 mg/dL — AB (ref <117)

## 2013-11-03 LAB — HEPATIC FUNCTION PANEL
ALBUMIN: 3.8 g/dL (ref 3.5–5.2)
ALT: 56 U/L — ABNORMAL HIGH (ref 0–35)
AST: 41 U/L — ABNORMAL HIGH (ref 0–37)
Alkaline Phosphatase: 138 U/L — ABNORMAL HIGH (ref 39–117)
Bilirubin, Direct: 0.1 mg/dL (ref 0.0–0.3)
Indirect Bilirubin: 0.5 mg/dL (ref 0.2–1.2)
TOTAL PROTEIN: 6.3 g/dL (ref 6.0–8.3)
Total Bilirubin: 0.6 mg/dL (ref 0.2–1.2)

## 2013-11-03 LAB — LIPID PANEL
CHOLESTEROL: 150 mg/dL (ref 0–200)
HDL: 43 mg/dL (ref >39)
LDL Cholesterol: 63 mg/dL (ref 0–99)
TRIGLYCERIDES: 221 mg/dL — AB (ref <150)
Total CHOL/HDL Ratio: 3.5 Ratio
VLDL: 44 mg/dL — ABNORMAL HIGH (ref 0–40)

## 2013-11-03 LAB — HEPATITIS B SURFACE ANTIBODY,QUALITATIVE: HEP B S AB: NEGATIVE

## 2013-11-03 LAB — TSH: TSH: 0.621 u[IU]/mL (ref 0.350–4.500)

## 2013-11-03 LAB — HEPATITIS B CORE ANTIBODY, TOTAL: Hep B Core Total Ab: NONREACTIVE

## 2013-11-03 LAB — INSULIN, FASTING: INSULIN FASTING, SERUM: 19 u[IU]/mL (ref 3–28)

## 2013-11-03 LAB — VITAMIN D 25 HYDROXY (VIT D DEFICIENCY, FRACTURES): VIT D 25 HYDROXY: 33 ng/mL (ref 30–89)

## 2013-11-03 LAB — HIV ANTIBODY (ROUTINE TESTING W REFLEX): HIV: NONREACTIVE

## 2013-11-03 LAB — HEPATITIS A ANTIBODY, TOTAL: HEP A TOTAL AB: NONREACTIVE

## 2013-11-03 LAB — MAGNESIUM: MAGNESIUM: 1.7 mg/dL (ref 1.5–2.5)

## 2013-11-03 NOTE — Telephone Encounter (Signed)
Lmom to pt to return my call for critical lab results.

## 2013-11-06 ENCOUNTER — Other Ambulatory Visit: Payer: Self-pay | Admitting: *Deleted

## 2013-11-06 LAB — HEPATITIS B E ANTIBODY: Hepatitis Be Antibody: NONREACTIVE

## 2013-11-06 MED ORDER — GLIPIZIDE 10 MG PO TABS: 10.0000 mg | ORAL_TABLET | Freq: Every day | ORAL | Status: DC

## 2013-11-08 ENCOUNTER — Encounter: Payer: Self-pay | Admitting: Internal Medicine

## 2013-11-08 NOTE — Progress Notes (Signed)
   Subjective:    Patient ID: Allison GuildSheri Apostol, female    DOB: 11/03/1955, 58 y.o.   MRN: [id number]  HPI  error  Review of Systems     Objective:   Physical Exam        Assessment & Plan:

## 2013-11-08 NOTE — Progress Notes (Deleted)
   Subjective:    Patient ID: Allison GuildSheri [name], female    DOB: 01/30/1956, 58 y.o.   MRN: [id number]  HPI 58 yo Sep WF with HTN , T2 DM, HLD and Bipolar Disorder who was in recently for CPE and was supposed to be taking Metformin and Farxiga had lab returns with Glucose of 526 mg%  and  Nl CO2  Current Outpatient Prescriptions on File Prior to Visit  Medication Sig Dispense Refill  . ALPRAZolam (XANAX) 0.5 MG tablet Take 0.5 mg by mouth 3 (three) times daily as needed for anxiety.      Marland Kitchen. aspirin 81 MG chewable tablet Chew by mouth daily.      . bisoprolol-hydrochlorothiazide (ZIAC) 5-6.25 MG per tablet Take 1 tablet by mouth daily.      . carbamazepine (TEGRETOL) 200 MG tablet       . Cholecalciferol (VITAMIN D PO) Take 2,000 Int'l Units by mouth daily. Takes 4 per day=8000 units      . Dapagliflozin Propanediol (FARXIGA) 10 MG TABS Take 10 mg by mouth daily.      Marland Kitchen. doxycycline (VIBRAMYCIN) 100 MG capsule Take 1 capsule 2 x daily with food for skin infection  60 capsule  0  . fluconazole (DIFLUCAN) 150 MG tablet Take 1 tablet weekly for skin yeast infection  4 tablet  12  . glipiZIDE (GLUCOTROL) 10 MG tablet Take 1 tablet (10 mg total) by mouth daily. Take 1/2 - 1 tab tid ac for diabetes  90 tablet  2  . metFORMIN (GLUCOPHAGE-XR) 500 MG 24 hr tablet TAKE ONE TABLET BY MOUTH WITH BREAKFAST AND LUNCH AND TWO TABLETS WITH SUPPER FOR DIABETES  120 tablet  1  . pravastatin (PRAVACHOL) 40 MG tablet Take 40 mg by mouth daily.      . QUEtiapine Fumarate (SEROQUEL PO) Take by mouth. Takes 50 mg a.m. And 300 mg p.m.       No current facility-administered medications on file prior to visit.   No Known Allergies Past Medical History  Diagnosis Date  . Hypertension   . Hyperlipidemia   . Type II or unspecified type diabetes mellitus without mention of complication, not stated as uncontrolled   . Arthritis   . Chronic lumbar pain      Review of Systems     Objective:   Physical Exam         Assessment & Plan:

## 2013-11-14 ENCOUNTER — Ambulatory Visit (INDEPENDENT_AMBULATORY_CARE_PROVIDER_SITE_OTHER): Payer: Medicare HMO | Admitting: Internal Medicine

## 2013-11-14 ENCOUNTER — Encounter: Payer: Self-pay | Admitting: Internal Medicine

## 2013-11-14 VITALS — BP 104/66 | HR 64 | Temp 97.7°F | Resp 16 | Ht 66.0 in | Wt 184.0 lb

## 2013-11-14 DIAGNOSIS — IMO0001 Reserved for inherently not codable concepts without codable children: Secondary | ICD-10-CM

## 2013-11-14 DIAGNOSIS — E1165 Type 2 diabetes mellitus with hyperglycemia: Principal | ICD-10-CM

## 2013-11-14 NOTE — Progress Notes (Signed)
Subjective:    Patient ID: Allison [name], female    DOB: [birth year], 58 y.o.   MRN: [id number]  HPI Patient returns for F/U after OV on 11/02/2013 with random blood glucose of 526 mg% and nl BUN and CO2. Apparently she has been very sporatic and poorly compliant in taking her Metformin which has been prescribed since Sept 2013.  She also has Stage III CKD (GFR 52 ml/min). She denies any diabetic polys, paresthesias or visual blurring. When finally contacted she resumed her Metformin and in addition had glipizide  10 mg tid with meals added to her regimen.   Medication List     ALPRAZolam 0.5 MG tablet  Commonly known as:  XANAX  Take 0.5 mg by mouth 3 (three) times daily as needed for anxiety.     aspirin 81 MG chewable tablet  Chew by mouth daily.     bisoprolol-hydrochlorothiazide 5-6.25 MG per tablet  Commonly known as:  ZIAC  Take 1 tablet by mouth daily.     carbamazepine 200 MG tablet  Commonly known as:  TEGRETOL     doxycycline 100 MG capsule  Commonly known as:  VIBRAMYCIN  Take 1 capsule 2 x daily with food for skin infection     FARXIGA 10 MG Tabs  Generic drug:  Dapagliflozin Propanediol  Take 10 mg by mouth daily.     fluconazole 150 MG tablet  Commonly known as:  DIFLUCAN  Take 1 tablet weekly for skin yeast infection     glipiZIDE 10 MG tablet  Commonly known as:  GLUCOTROL  Take 1 tablet (10 mg total) by mouth daily. Take 1/2 - 1 tab tid ac for diabetes     lisinopril-hydrochlorothiazide 20-12.5 MG per tablet  Commonly known as:  PRINZIDE,ZESTORETIC     metFORMIN 500 MG 24 hr tablet  Commonly known as:  GLUCOPHAGE-XR  TAKE ONE TABLET BY MOUTH WITH BREAKFAST AND LUNCH AND TWO TABLETS WITH SUPPER FOR DIABETES     pravastatin 40 MG tablet  Commonly known as:  PRAVACHOL  Take 40 mg by mouth daily.     SEROQUEL PO  Take by mouth. Takes 50 mg a.m. And 300 mg p.m.     VITAMIN D PO  Take 2,000 Int'l Units by mouth daily. Takes 4 per day=8000 units        No Known Allergies  Past Medical History  Diagnosis Date  . Hypertension   . Hyperlipidemia   . Chronic lumbar pain   . 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 Generalized osteoarthrosis, involving multiple sites   . Acute drug-induced gout of right ankle   . Acute gout due to renal impairment involving ankle   . 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  Objective:   Physical Exam  BP 104/66  Pulse 64  Temp(Src) 97.7 F (36.5 C) (Temporal)  Resp 16  Ht 5\' 6"  (1.676 m)  Wt 184 lb (83.462 kg)  BMI 29.71 kg/m2  HEENT - Eac's patent. TM's Nl.EOM's full. PERRLA. NasoOroPharynx clear. Neck - supple. Nl Thyroid. No bruits nodes JVD Chest - Clear equal BS Cor - Nl HS. RRR w/o sig MGR. PP 1(+) No edema. Abd - No palpable organomegaly, masses or tenderness. BS nl. MS- FROM. w/o deformities. Muscle power tone and bulk Nl. Gait Nl. Neuro - No obvious Cr N abnormalities. Sensory, motor and Cerebellar functions appear Nl  w/o focal abnormalities.  Assessment & Plan:   1. T2 NIDDM, poorly controlled   - Long discussion re: dietary compliance/wt loss  as well as med compliance and glucose monitoring - further discussed when CBG's are consistently below 120 mg% to rapidly taper to D/C her glipizide to facilitate weight loss.

## 2013-11-14 NOTE — Patient Instructions (Signed)
Vegetarian Diets Many foods in the vegetarian diet (nuts, legumes, seeds, whole grains, and fresh fruits and vegetables) contain large amounts of fiber, which give a feeling of fullness (satiation) after meals. Vegetarian eating plans may provide significant health benefits including lowering rates of heart disease, obesity, diabetes, cancer, and high blood pressure. A vegetarian diet is usually low in cholesterol and saturated fat due to the high amount of grains, fruits, and vegetables and the elimination of meats. In addition, a vegetarian diet may have some advantages for people with diabetes. These advantages include:  Helping to maintain normal blood glucose levels.  Promoting a healthy weight.  Improving blood glucose control and insulin response.  Reducing the risk for cardiovascular disease. TYPES OF VEGETARIAN DIETS Vegans. These are vegetarians who consume only plant food. No animal foods of any kind are eaten. Anyone following such a diet will need to select fortified foods or take a vitamin B12 supplement. Vegans also may need a vitamin D supplement if sun exposure is limited. Some foods are fortified with vitamin D that can be chosen as well.  Lacto-vegetarians. These are vegetarians who eat dairy products (milk, cheese, and yogurt) in addition to plant foods. They do not eat meat, poultry, fish, or eggs. Lacto-ovo vegetarians. These are vegetarians who eat plant foods, eggs, and dairy products, but do not eat meat, fish, or poultry. LIMITING PROTEINS Meals should be planned to provide adequate sources of proteins as they may be limited in an unbalanced vegetarian diet. Good sources of proteins include beans or legumes, soy products, nuts, seeds, eggs, milk, and cheese. The list below includes food sources of protein. Your Registered Dietitian can help you determine your individual protein needs.  Beans and Peas (Dried and Cooked) Each serving represents 7 to 8 grams of  protein.  Black beans, 1 cup.  Black-eyed peas, 1 cup.  Calico, 1 cup.  Garbanzo, chickpeas,  cup.  Kidney beans, 1 cup.  Lentils, 1 cup.  Lima beans, 1 cup.  Mung beans, 2 cups.  Navy beans,  cup.  Pinto beans,  cup.  Split peas,  cup.  Split pea soup, 1 cup. Meat Substitutes Each serving represents 7 to 8 grams of protein.  Bacon substitute, 2 tbs.  Hummus,  cup.  Soybeans, cup.  Textured vegetable protein,  oz.  Tofu (2 inch x 2 inch x 1 inch cube),  cup (4 oz). Nuts and Seeds Each serving represents 7 to 8 grams of protein.  Almonds, dry-roasted,  cup (1 oz).  Brazil nuts,  cup (1 oz).  Butternuts,  cup (1 oz).  Peanuts, roasted,  cup (1 oz).  Pecans,  cup (1 oz).  Pignolias, pine nuts, 2 tbs.  Pistachios, 60 nuts (1 oz).  Pumpkin or squash seeds,  cup.  Sunflower seeds (no hull),  cup.  Sunflower seeds (with hulls),  cup.  Walnuts,  cup (1 oz). Milk/Milk Substitutes Each serving represents 7 to 8 grams of protein.  Soy milk, fortified, 1 cup.  Goat milk, 1 cup.  Kefir, 1 cup. SAMPLE MEAL PLAN - 2000 CALORIES  Carbohydrate: 276 grams (55% of total calories).  Protein: 95 grams (19% of total calories).  Fat: 60 grams (26% of total calories). Breakfast  Skim milk, 1 cup (8g*).  Orange juice,  cup.  1 slice whole-wheat toast (3g).  Flaked corn cereal,  cup (3g).  Margarine, 2 tsp. Morning snack  1 apple or 6 whole-wheat crackers (3g). Noon meal  Skim milk, 1 cup (8g).    Cottage cheese,  cup (8g).  1 medium orange.  1 pita bread filled with  cup garbanzo spread, chopped tomatoes, onions, and lettuce (15g).  Tahini sauce, 1 tsp. Afternoon snack   banana.  4 light, crispy rye crackers (3g). Evening meal  Green salad with bean sprouts (4g).  Pineapple with juice,  cup.  Spaghetti,  cup (3g).  Lentil spaghetti sauce, 1 cup (3g).  [place] dressing, 1 tbs. Evening snack  Skim milk, 1 cup  (8g).  Peanuts,  cup (7g).  Popcorn (lightly salted, no butter), 3 cups (3g). * Grams of protein. Document Released: 07/30/2003 Document Revised: 10/19/2011 Document Reviewed: 01/15/2011 [facility] Patient Information 2014 [place], [place].   Diabetes and Exercise Exercising regularly is important. It is not just about losing weight. It has many health benefits, such as:  Improving your overall fitness, flexibility, and endurance.  Increasing your bone density.  Helping with weight control.  Decreasing your body fat.  Increasing your muscle strength.  Reducing stress and tension.  Improving your overall health. People with diabetes who exercise gain additional benefits because exercise:  Reduces appetite.  Improves the body's use of blood sugar (glucose).  Helps lower or control blood glucose.  Decreases blood pressure.  Helps control blood lipids (such as cholesterol and triglycerides).  Improves the body's use of the hormone insulin by:  Increasing the body's insulin sensitivity.  Reducing the body's insulin needs.  Decreases the risk for heart disease because exercising:  Lowers cholesterol and triglycerides levels.  Increases the levels of good cholesterol (such as high-density lipoproteins [HDL]) in the body.  Lowers blood glucose levels. YOUR ACTIVITY PLAN  Choose an activity that you enjoy and set realistic goals. Your health care provider or diabetes educator can help you make an activity plan that works for you. You can break activities into 2 or 3 sessions throughout the day. Doing so is as good as one long session. Exercise ideas include:  Taking the dog for a walk.  Taking the stairs instead of the elevator.  Dancing to your favorite song.  Doing your favorite exercise with a friend. RECOMMENDATIONS FOR EXERCISING WITH TYPE 1 OR TYPE 2 DIABETES   Check your blood glucose before exercising. If blood glucose levels are greater than 240 mg/dL,  check for urine ketones. Do not exercise if ketones are present.  Avoid injecting insulin into areas of the body that are going to be exercised. For example, avoid injecting insulin into:  The arms when playing tennis.  The legs when jogging.  Keep a record of:  Food intake before and after you exercise.  Expected peak times of insulin action.  Blood glucose levels before and after you exercise.  The type and amount of exercise you have done.  Review your records with your health care provider. Your health care provider will help you to develop guidelines for adjusting food intake and insulin amounts before and after exercising.  If you take insulin or oral hypoglycemic agents, watch for signs and symptoms of hypoglycemia. They include:  Dizziness.  Shaking.  Sweating.  Chills.  Confusion.  Drink plenty of water while you exercise to prevent dehydration or heat stroke. Body water is lost during exercise and must be replaced.  Talk to your health care provider before starting an exercise program to make sure it is safe for you. Remember, almost any type of activity is better than none. Document Released: 10/17/2003 Document Revised: 03/29/2013 Document Reviewed: 01/03/2013 [facility] Patient Information 2014 [place], [place].  Diabetes and Exercise Exercising regularly is important. It is not just about losing weight. It has many health benefits, such as:  Improving your overall fitness, flexibility, and endurance.  Increasing your bone density.  Helping with weight control.  Decreasing your body fat.  Increasing your muscle strength.  Reducing stress and tension.  Improving your overall health. People with diabetes who exercise gain additional benefits because exercise:  Reduces appetite.  Improves the body's use of blood sugar (glucose).  Helps lower or control blood glucose.  Decreases blood pressure.  Helps control blood lipids (such as cholesterol  and triglycerides).  Improves the body's use of the hormone insulin by:  Increasing the body's insulin sensitivity.  Reducing the body's insulin needs.  Decreases the risk for heart disease because exercising:  Lowers cholesterol and triglycerides levels.  Increases the levels of good cholesterol (such as high-density lipoproteins [HDL]) in the body.  Lowers blood glucose levels. YOUR ACTIVITY PLAN  Choose an activity that you enjoy and set realistic goals. Your health care provider or diabetes educator can help you make an activity plan that works for you. You can break activities into 2 or 3 sessions throughout the day. Doing so is as good as one long session. Exercise ideas include:  Taking the dog for a walk.  Taking the stairs instead of the elevator.  Dancing to your favorite song.  Doing your favorite exercise with a friend. RECOMMENDATIONS FOR EXERCISING WITH TYPE 1 OR TYPE 2 DIABETES   Check your blood glucose before exercising. If blood glucose levels are greater than 240 mg/dL, check for urine ketones. Do not exercise if ketones are present.  Avoid injecting insulin into areas of the body that are going to be exercised. For example, avoid injecting insulin into:  The arms when playing tennis.  The legs when jogging.  Keep a record of:  Food intake before and after you exercise.  Expected peak times of insulin action.  Blood glucose levels before and after you exercise.  The type and amount of exercise you have done.  Review your records with your health care provider. Your health care provider will help you to develop guidelines for adjusting food intake and insulin amounts before and after exercising.  If you take insulin or oral hypoglycemic agents, watch for signs and symptoms of hypoglycemia. They include:  Dizziness.  Shaking.  Sweating.  Chills.  Confusion.  Drink plenty of water while you exercise to prevent dehydration or heat stroke. Body  water is lost during exercise and must be replaced.  Talk to your health care provider before starting an exercise program to make sure it is safe for you. Remember, almost any type of activity is better than none. Document Released: 10/17/2003 Document Revised: 03/29/2013 Document Reviewed: 01/03/2013 [facility] Patient Information 2014 [place]

## 2013-11-24 ENCOUNTER — Other Ambulatory Visit: Payer: Self-pay | Admitting: Internal Medicine

## 2013-11-24 MED ORDER — ALPRAZOLAM 0.5 MG PO TABS: 0.5000 mg | ORAL_TABLET | Freq: Three times a day (TID) | ORAL | Status: DC | PRN

## 2013-11-28 ENCOUNTER — Telehealth: Payer: Self-pay | Admitting: *Deleted

## 2013-11-28 NOTE — Telephone Encounter (Signed)
RX for xanax was called in 11/24/13 for .5 mg & is on 1mg  TID  Pt thinks this is a error said she has been on 1mg  for 563yrs  Can we call back to [facility] 1mg ?

## 2013-11-29 ENCOUNTER — Other Ambulatory Visit: Payer: Self-pay | Admitting: Internal Medicine

## 2013-11-29 ENCOUNTER — Telehealth: Payer: Self-pay | Admitting: *Deleted

## 2013-11-29 NOTE — Telephone Encounter (Signed)
Spoke with patient regarding Xanax.  Per Dr Oneta RackMcKeown, he will not increase dose to 1 mg and should see her psychiatrist regarding dose.

## 2013-11-29 NOTE — Telephone Encounter (Signed)
Per Dr Oneta RackMcKeown, [facility] has been on Xanax 0.5 mg and she will need to see her psychiatrist to increase.  Patient aware and still states she was on 1 mg and will discuss at next OV with Dr Oneta RackMcKeown.

## 2013-12-18 ENCOUNTER — Encounter: Payer: Self-pay | Admitting: Internal Medicine

## 2013-12-18 ENCOUNTER — Ambulatory Visit (INDEPENDENT_AMBULATORY_CARE_PROVIDER_SITE_OTHER): Payer: Commercial Managed Care - HMO | Admitting: Internal Medicine

## 2013-12-18 VITALS — BP 108/76 | HR 102 | Temp 98.4°F | Resp 16 | Ht 66.5 in | Wt 188.0 lb

## 2013-12-18 DIAGNOSIS — Z79899 Other long term (current) drug therapy: Secondary | ICD-10-CM

## 2013-12-18 DIAGNOSIS — I10 Essential (primary) hypertension: Secondary | ICD-10-CM

## 2013-12-18 DIAGNOSIS — F3162 Bipolar disorder, current episode mixed, moderate: Secondary | ICD-10-CM

## 2013-12-18 DIAGNOSIS — E119 Type 2 diabetes mellitus without complications: Secondary | ICD-10-CM

## 2013-12-18 MED ORDER — ALPRAZOLAM 1 MG PO TABS: 1.0000 mg | ORAL_TABLET | Freq: Three times a day (TID) | ORAL | Status: DC | PRN

## 2013-12-18 NOTE — Progress Notes (Signed)
Subjective:    Patient ID: Allison GuildSheri Platner, female    DOB: 02/24/1956, 58 y.o.   MRN: [id number]  HPI Patient returns for F/U of non-compliance off diabetic meds in late March with a random glucose of 526 mg% and A1c of 15.9% and was restarted on meds including Metformin, Farxiga and Glipizide.  Patient alleges she has been unable to use her glucometer. And  She has failed to follow -up until she was required to come in for a Xanax Refill. [facility] and Tegretol (which is low dose).  Patient has gained about 45-50 # since she was originally started on Seroquel about 8-9 years ago. Patient feels that she is unable to function at lower dose of Xanax.    Medication List       This list is accurate as of: 12/18/13  9:40 PM.  Always use your most recent med list.               ALPRAZolam 1 MG tablet  Commonly known as:  XANAX  Take 1 tablet (1 mg total) by mouth 3 (three) times daily as needed for anxiety.     aspirin 81 MG chewable tablet  Chew by mouth daily.     carbamazepine 200 MG tablet  Commonly known as:  TEGRETOL  Take 200 mg by mouth daily.     doxycycline 100 MG capsule  Commonly known as:  VIBRAMYCIN  Take 1 capsule 2 x daily with food for skin infection     FARXIGA 10 MG Tabs  Generic drug:  Dapagliflozin Propanediol  Take 10 mg by mouth daily.     fluconazole 150 MG tablet  Commonly known as:  DIFLUCAN  Take 1 tablet weekly for skin yeast infection     glipiZIDE 10 MG tablet  Commonly known as:  GLUCOTROL  Take 1 tablet (10 mg total) by mouth daily. Take 1/2 - 1 tab tid ac for diabetes     lisinopril-hydrochlorothiazide 20-12.5 MG per tablet  Commonly known as:  PRINZIDE,ZESTORETIC  Take 1 tablet by mouth daily.     metFORMIN 500 MG 24 hr tablet  Commonly known as:  GLUCOPHAGE-XR  TAKE ONE TABLET BY MOUTH WITH BREAKFAST AND LUNCH AND TWO TABLETS WITH SUPPER FOR DIABETES     pravastatin 40 MG tablet   Commonly known as:  PRAVACHOL  Take 40 mg by mouth daily.     SEROQUEL PO  Take by mouth. Takes 50 mg a.m. And 300 mg p.m.     VITAMIN D PO  Take 2,000 Int'l Units by mouth daily. Takes 4 per day=8000 units      No Known Allergies Past Medical History  Diagnosis Date  . Hypertension   . Hyperlipidemia   . Chronic lumbar pain   . 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 Generalized osteoarthrosis, involving multiple sites   . Acute drug-induced gout of right ankle   . Acute gout due to renal impairment involving ankle   . 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  A full 10 point Review of Systems was done, except as stated above, all other Review of Systems were negative  Objective:   Physical Exam  BP 108/76  Pulse 102  Temp 98.4 F   Resp 16  Ht 5' 6.5"   Wt 188 lb  BMI 29.89 kg/m2  HEENT - Eac's patent. TM's Nl.EOM's full. PERRLA. NasoOroPharynx clear. Neck - supple. Nl Thyroid. No bruits nodes JVD Chest - Clear equal BS Cor - Nl HS. RRR w/o sig MGR. PP 1(+) No edema. MS- FROM. w/o deformities. Muscle power tone and bulk Nl. Gait Nl. Neuro - No obvious Cr N abnormalities. Sensory, motor and Cerebellar functions appear Nl w/o focal abnormalities. Psych- No delusions or hallucinations at present.  Assessment & Plan:   1. Hypertension  2. T2 NIDDM w/Stage III CKD (52 ml/min)  - poor compliance and poor control  - Insulin, fasting - Hemoglobin A1c  3. Bipolar 1 disorder, mixed, moderate  - ALPRAZolam (XANAX) 1 MG tablet; Take 1 tablet (1 mg total) by mouth 3 (three) times daily as needed for anxiety.  Dispense: 90 tablet; Refill: 0 - Patient was advised that she would get i mor refill of the Xanax 1 mg to last until she could [facility] and discuss possible increase dose of Tegretol and whether to switch her Xanax to some other anti-anxiety agent , as i advised her that I'm concerned she's developed tolerance  to the Xanax.  4. Encounter for long-term (current) use of other medications  - BASIC METABOLIC PANEL WITH GFR  ROV 1 Month

## 2013-12-18 NOTE — Patient Instructions (Signed)
Type 1 Diabetes Mellitus, Adult Type 1 diabetes mellitus, often simply referred to as diabetes, is a long-term (chronic) disease. It occurs when the islet cells in the pancreas that make insulin (a hormone) are destroyed and can no longer make insulin. Insulin is needed to move sugars from food into the tissue cells. The tissue cells use the sugars for energy. In people with type 1 diabetes, the sugars build up in the blood instead of going into the tissue cells. As a result, high blood sugar (hyperglycemia) develops. Without insulin, the body breaks down fat cells for the needed energy. This breakdown of fat cells produces acid chemicals (ketones), which increases the acid levels in the body. The effect of either high ketone or sugar (glucose) levels can be life-threatening.  Type 1 diabetes was also previously called juvenile diabetes. It most often occurs before the age of 82, but it can occur at any age. RISK FACTORS A person is predisposed to developing type 1 diabetes if someone in his or her family has the disease and is exposed to certain additional environmental triggers.  SYMPTOMS  Symptoms of type 1 diabetes may develop gradually over days to weeks or suddenly. The symptoms occur due to hyperglycemia. The symptoms can include:   Increased thirst (polydipsia).  Increased urination (polyuria).  Increased urination during the night (nocturia).  Weight loss. This weight loss may be rapid.  Frequent, recurring infections.  Tiredness (fatigue).  Weakness.  Vision changes, such as blurred vision.  Fruity smell to your breath.  Abdominal pain.  Nausea or vomiting. DIAGNOSIS  Type 1 diabetes is diagnosed when symptoms of diabetes are present and when blood glucose levels are increased. Your blood glucose level may be checked by one or more of the following blood tests:  A fasting blood glucose test. You will not be allowed to eat for at least 8 hours before a blood sample is  taken.  A random blood glucose test. Your blood glucose is checked at any time of the day regardless of when you ate.  A hemoglobin A1c blood glucose test. A hemoglobin A1c test provides information about blood glucose control over the previous 3 months. TREATMENT  Although type 1 diabetes cannot be prevented, it can be managed with insulin, diet, and exercise.  You will need to take insulin daily to keep blood glucose in the desired range.  You will need to match insulin dosing with exercise and healthy food choices. The treatment goal is to maintain the before-meal blood sugar (preprandial glucose) level at 70 130 mg/dL.  HOME CARE INSTRUCTIONS   Have your hemoglobin A1c level checked twice a year.  Perform daily blood glucose monitoring as directed by your caregiver.  Monitor urine ketones when you are ill and as directed by your caregiver.  Take your insulin as directed by your caregiver to maintain your blood glucose level in the desired range.  Never run out of insulin. It is needed every day.  Adjust insulin based on your intake of carbohydrates. Carbohydrates can raise blood glucose levels but need to be included in your diet. Carbohydrates provide vitamins, minerals, and fiber, which are an essential part of a healthy diet. Carbohydrates are found in fruits, vegetables, whole grains, dairy products, legumes, and foods containing added sugars.    Eat healthy foods. Alternate 3 meals with 3 snacks.  Maintain a healthy weight.  Carry a medical alert card or wear your medical alert jewelry.  Carry a 15 gram carbohydrate snack with you  at all times to treat low blood glucose (hypoglycemia). Some examples of 15 gram carbohydrate snacks include:  Glucose tablets, 3 or 4.   Glucose gel, 15 gram tube.  Raisins, 2 tablespoons (24 grams).  Jelly beans, 6.  Animal crackers, 8.  Fruit juice, regular soda, or low-fat milk, 4 ounces (120 mL).  Gummy treats,  9.    Recognize hypoglycemia. Hypoglycemia occurs with blood glucose levels of 70 mg/dL and below. The risk for hypoglycemia increases when fasting or skipping meals, during or after intense exercise, and during sleep. Hypoglycemia symptoms can include:  Tremors or shakes.  Decreased ability to concentrate.  Sweating.  Increased heart rate.  Headache.  Dry mouth.  Hunger.  Irritability.  Anxiety.  Restless sleep.  Altered speech or coordination.  Confusion.  Treat hypoglycemia promptly. If you are alert and able to safely swallow, follow the 15:15 rule:  Take 15 20 grams of rapid-acting glucose or carbohydrate. Rapid-acting options include glucose gel, glucose tablets, or 4 ounces (120 mL) of fruit juice, regular soda, or low-fat milk.  Check your blood glucose level 15 minutes after taking the glucose.   Take 15 20 grams more of glucose if the repeat blood glucose level is still 70 mg/dL or below.  Eat a meal or snack within 1 hour once blood glucose levels return to normal.  Be alert to polyuria and polydipsia, which are early signs of hyperglycemia. An early awareness of hyperglycemia allows for prompt treatment. Treat hyperglycemia as directed by your caregiver.  Engage in at least 150 minutes of moderate-intensity physical activity a week, spread over at least 3 days of the week or as directed by your caregiver.  Adjust your insulin dosing and food intake as needed if you start a new exercise or sport.  Follow your sick day plan at any time you are unable to eat or drink as usual.   Avoid tobacco use.  Limit alcohol intake to no more than 1 drink per day for nonpregnant women and 2 drinks per day for men. You should drink alcohol only when you are also eating food. Talk with your caregiver about whether alcohol is safe for you. Tell your caregiver if you drink alcohol several times a week.  Follow up with your caregiver regularly.  Schedule an eye exam  within 5 years of diagnosis and then annually.  Perform daily skin and foot care. Examine your skin and feet daily for cuts, bruises, redness, nail problems, bleeding, blisters, or sores. A foot exam by a caregiver should be done annually.  Brush your teeth and gums at least twice a day and floss at least once a day. Follow up with your dentist regularly.  Share your diabetes management plan with your workplace or school.  Stay up-to-date with immunizations.  Learn to manage stress.  Obtain ongoing diabetes education and support as needed.  Participate or seek rehabilitation as needed to maintain or improve independence and quality of life. Request a physical or occupational therapy referral if you are having foot or hand numbness or difficulties with grooming, dressing, eating, or physical activity. SEEK MEDICAL CARE IF:   You are unable to eat food or drink fluids for more than 6 hours.  You have nausea and vomiting for more than 6 hours.  Your blood glucose level is over 240 mg/dL.  There is a change in mental status.  You develop an additional serious illness.  You have diarrhea for more than 6 hours.  You have been  sick or have had a fever for a couple of days and are not getting better.  You have pain during any physical activity. SEEK IMMEDIATE MEDICAL CARE IF:  You have difficulty breathing.  You have moderate to large ketone levels. MAKE SURE YOU:  Understand these instructions.  Will watch your condition.  Will get help right away if you are not doing well or get worse. Document Released: 07/24/2000 Document Revised: 04/20/2012 Document Reviewed: 02/23/2012 [facility] Patient Information 2014 [place]ExitCare, [place].   Insulin Resistance Blood sugar (glucose) levels are controlled by a hormone called insulin. Insulin is made by your pancreas. When your blood glucose goes up, insulin is released into your blood. Insulin is required for your body to function normally.  However, your body can become resistant to your own insulin or to insulin given to treat diabetes. In either case, insulin resistance can lead to serious problems. These problems include:  Type 2 diabetes.  Heart disease.  High blood pressure.  Stroke.  Polycystic ovary syndrome.  Fatty liver. CAUSES  Insulin resistance can develop for many different reasons. It is more likely to happen in people with these conditions or characteristics:  Obesity.  Inactivity.  Pregnancy.  High blood pressure.  Stress.  Steroid use.  Infection or severe illness.  Increased levels of cholesterol and triglycerides. SYMPTOMS  There are no symptoms. You may have symptoms related to the various complications of insulin resistance.  DIAGNOSIS  Several different things can make your caregiver suspect you have insulin resistance. These include:  High blood glucose (hyperglycemia).  Abnormal cholesterol levels.  High uric acid levels.  Changes related to blood pressure.  Changes related to inflammation. Insulin resistance can be determined with blood tests. An elevated insulin level when you have not eaten might suggest resistance. Other more complicated tests are sometimes necessary. TREATMENT  Lifestyle changes are the most important treatment for insulin resistance.   If you are overweight and you have insulin resistance, you can improve your insulin sensitivity by losing weight.  Moderate exercise for 30 40 minutes, 4 days a week, can improve insulin sensitivity. Some medicines can also help improve your insulin sensitivity. Your caregiver can discuss these with you if they are appropriate.  HOME CARE INSTRUCTIONS   Do not smoke.  Keep your weight at a healthy level.  Get exercise.  If you have diabetes, follow your caregiver's directions.  If you have high blood pressure, follow your caregiver's directions.  Only take prescription medicines for pain, fever, or discomfort  as directed by your caregiver. SEEK MEDICAL CARE IF:   You are diabetic and you are having problems keeping your blood glucose levels at target range.  You are having episodes of low blood glucose (hypoglycemia).  You feel you might be having side effects from your medicines.  You have symptoms of an illness that is not improving after 3 4 days.  You have a sore or wound that is not healing.  You notice a change in vision or a new problem with your vision. SEEK IMMEDIATE MEDICAL CARE IF:   Your blood glucose goes below 70, especially if you have confusion, lightheadedness, or other symptoms with it.  Your blood glucose is very high (as advised by your caregiver) twice in a row.  You pass out.  You have chest pain or trouble breathing.  You have a sudden, severe headache.  You have sudden weakness in one arm or one leg.  You have sudden difficulty speaking or swallowing.  You develop vomiting or diarrhea that is getting worse or not improving after 1 day. Document Released: 09/15/2005 Document Revised: 01/26/2012 Document Reviewed: 01/05/2013 [facility] Patient Information 2014 [place]ExitCare, [place]LL[place].

## 2013-12-19 LAB — BASIC METABOLIC PANEL WITH GFR
BUN: 13 mg/dL (ref 6–23)
CALCIUM: 9.2 mg/dL (ref 8.4–10.5)
CO2: 22 mEq/L (ref 19–32)
Chloride: 101 mEq/L (ref 96–112)
Creat: 0.87 mg/dL (ref 0.50–1.10)
GFR, EST NON AFRICAN AMERICAN: 75 mL/min
GFR, Est African American: 86 mL/min
GLUCOSE: 303 mg/dL — AB (ref 70–99)
Potassium: 4.1 mEq/L (ref 3.5–5.3)
Sodium: 138 mEq/L (ref 135–145)

## 2013-12-19 LAB — HEMOGLOBIN A1C
HEMOGLOBIN A1C: 12.9 % — AB (ref <5.7)
Mean Plasma Glucose: 324 mg/dL — ABNORMAL HIGH (ref <117)

## 2013-12-19 LAB — INSULIN, FASTING: Insulin fasting, serum: 43 u[IU]/mL — ABNORMAL HIGH (ref 3–28)

## 2013-12-21 ENCOUNTER — Telehealth: Payer: Self-pay

## 2013-12-21 NOTE — Telephone Encounter (Signed)
lmom to pt to return my call for lab results. 

## 2013-12-21 NOTE — Telephone Encounter (Signed)
Message copied by Joya MartyrZMENT, Kasen Adduci M on Thu Dec 21, 2013  9:37 AM ------      Message from: Lucky CowboyMCKEOWN, WILLIAM      Created: Thu Dec 21, 2013  9:13 AM       A1c 12.9 - still very very high- important to take meds as prescribed - and get on stricter diet and lose weight       Cut out sweets/candy and all white stuff rice potatoes breads and pasta ------

## 2013-12-27 ENCOUNTER — Other Ambulatory Visit: Payer: Self-pay | Admitting: Internal Medicine

## 2013-12-27 ENCOUNTER — Other Ambulatory Visit: Payer: Self-pay | Admitting: Emergency Medicine

## 2013-12-27 ENCOUNTER — Telehealth: Payer: Self-pay | Admitting: *Deleted

## 2013-12-27 MED ORDER — BLOOD GLUCOSE TEST VI STRP: ORAL_STRIP | Status: DC

## 2013-12-27 NOTE — Telephone Encounter (Signed)
NEEDS A NEW RX FOR FREE STYLE LITE TEST STRIPS

## 2014-01-18 ENCOUNTER — Ambulatory Visit: Payer: Self-pay | Admitting: Internal Medicine

## 2014-02-13 ENCOUNTER — Other Ambulatory Visit: Payer: Self-pay | Admitting: Emergency Medicine

## 2014-02-13 ENCOUNTER — Ambulatory Visit: Payer: Self-pay | Admitting: Physician Assistant

## 2014-02-13 ENCOUNTER — Other Ambulatory Visit: Payer: Self-pay | Admitting: Physician Assistant

## 2014-02-13 ENCOUNTER — Ambulatory Visit (INDEPENDENT_AMBULATORY_CARE_PROVIDER_SITE_OTHER): Payer: Commercial Managed Care - HMO | Admitting: Physician Assistant

## 2014-02-13 VITALS — BP 110/78 | HR 76 | Temp 98.2°F | Resp 16 | Ht 66.5 in | Wt 185.0 lb

## 2014-02-13 DIAGNOSIS — M545 Low back pain, unspecified: Secondary | ICD-10-CM

## 2014-02-13 DIAGNOSIS — I10 Essential (primary) hypertension: Secondary | ICD-10-CM

## 2014-02-13 DIAGNOSIS — IMO0001 Reserved for inherently not codable concepts without codable children: Secondary | ICD-10-CM

## 2014-02-13 DIAGNOSIS — E1165 Type 2 diabetes mellitus with hyperglycemia: Secondary | ICD-10-CM

## 2014-02-13 DIAGNOSIS — F3162 Bipolar disorder, current episode mixed, moderate: Secondary | ICD-10-CM

## 2014-02-13 MED ORDER — PREGABALIN 100 MG PO CAPS: 100.0000 mg | ORAL_CAPSULE | Freq: Two times a day (BID) | ORAL | Status: DC

## 2014-02-13 MED ORDER — CYCLOBENZAPRINE HCL 10 MG PO TABS: 10.0000 mg | ORAL_TABLET | Freq: Three times a day (TID) | ORAL | Status: DC | PRN

## 2014-02-13 NOTE — Progress Notes (Signed)
Assessment and Plan:  Hypertension: Continue medication, monitor blood pressure at home. Continue DASH diet. Cholesterol: Continue diet and exercise. Check cholesterol.  Diabetes-Continue diet and exercise. Check A1C Vitamin D Def- check level and continue medications.  Right back pain- get back on Meloxicam, can try lyrica thought discussed cost with patient, can do flexeril only giving 30  Continue diet and meds as discussed. Further disposition pending results of labs. Discussed med's effects and SE's.    HPI 58 y.o. female  presents for 3 month follow up with hypertension, hyperlipidemia, diabetes and vitamin D. Her blood pressure has been controlled at home, today their BP is BP: 110/78 mmHg She does not workout. She denies chest pain, shortness of breath, dizziness.  She is on cholesterol medication and denies myalgias. Her cholesterol is at goal. The cholesterol last visit was:   Lab Results  Component Value Date   CHOL 150 11/02/2013   HDL 43 11/02/2013   LDLCALC 63 11/02/2013   TRIG 221* 11/02/2013   CHOLHDL 3.5 11/02/2013   She has been working on diet and exercise for Diabetes, she is on 1/2 of glipizide 3 times a day, she is on metformin 3-4 times a day, sugars have been running 160-260, no low blood sugars, and denies polydipsia and polyuria. Last A1C in the office was:  Lab Results  Component Value Date   HGBA1C 12.9* 12/18/2013   Patient is on Vitamin D supplement. Lab Results  Component Value Date   VD25OH 79 11/02/2013   She states that she has been out on Disability due to her back since 2008. She states that her back is hurting her, per patient she has OA and bulging disk in her back. She states that she has been having sharp right flank pain with back spasms for 2 weeks. Worse with movements.  She has been on Lyrica in the past which helps with her back pain, she states the gabapentin did not help her.  She is out of her meloxicam.   Current Medications:  Current  Outpatient Prescriptions on File Prior to Visit  Medication Sig Dispense Refill  . ALPRAZolam (XANAX) 1 MG tablet Take 1 tablet (1 mg total) by mouth 3 (three) times daily as needed for anxiety.  90 tablet  0  . aspirin 81 MG chewable tablet Chew by mouth daily.      . carbamazepine (TEGRETOL) 200 MG tablet Take 200 mg by mouth daily.       . Cholecalciferol (VITAMIN D PO) Take 2,000 Int'l Units by mouth daily. Takes 4 per day=8000 units      . Dapagliflozin Propanediol (FARXIGA) 10 MG TABS Take 10 mg by mouth daily.      Marland Kitchen glipiZIDE (GLUCOTROL) 10 MG tablet Take 1 tablet (10 mg total) by mouth daily. Take 1/2 - 1 tab tid ac for diabetes  90 tablet  2  . Glucose Blood (BLOOD GLUCOSE TEST STRIPS) STRP Checks BS 1 QD, DX- 250.00 NPI [id number], needs free style lite test strips  100 each  6  . lisinopril-hydrochlorothiazide (PRINZIDE,ZESTORETIC) 20-12.5 MG per tablet Take 1 tablet by mouth daily.       . meloxicam (MOBIC) 15 MG tablet TAKE ONE TABLET BY MOUTH EVERY DAY AS NEEDED FOR PAIN AND FOR INFLAMMATION  90 tablet  0  . metFORMIN (GLUCOPHAGE-XR) 500 MG 24 hr tablet TAKE ONE TABLET BY MOUTH WITH BREAKFAST AND LUNCH AND TWO TABLETS WITH SUPPER FOR DIABETES  120 tablet  1  . pravastatin (  PRAVACHOL) 40 MG tablet Take 40 mg by mouth daily.      . QUEtiapine Fumarate (SEROQUEL PO) Take by mouth. Takes 50 mg a.m. And 300 mg p.m.       No current facility-administered medications on file prior to visit.   Medical History:  Past Medical History  Diagnosis Date  . Hypertension   . Hyperlipidemia   . Chronic lumbar pain   . 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 Generalized osteoarthrosis, involving multiple sites   . Acute drug-induced gout of right ankle   . Acute gout due to renal impairment involving ankle   . 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    Allergies: No Known Allergies   Review of Systems: [X]  = complains of  [ ]  =  denies  General: Fatigue [ ]  Fever [ ]  Chills [ ]  Weakness [ ]   Insomnia [ ]  Eyes: Redness [ ]  Blurred vision [ ]  Diplopia [ ]   ENT: Congestion [ ]  Sinus Pain [ ]  Post Nasal Drip [ ]  Sore Throat [ ]  Earache [ ]   Cardiac: Chest pain/pressure [ ]  SOB [ ]  Orthopnea [ ]   Palpitations [ ]   Paroxysmal nocturnal dyspnea[ ]  Claudication [ ]  Edema [ ]   Pulmonary: Cough [ ]  Wheezing[ ]   SOB [ ]   Snoring [ ]   GI: Nausea [ ]  Vomiting[ ]  Dysphagia[ ]  Heartburn[ ]  Abdominal pain [ ]  Constipation [ ] ; Diarrhea [ ] ; BRBPR [ ]  Melena[ ]  GU: Hematuria[ ]  Dysuria [ ]  Nocturia[ ]  Urgency [ ]   Hesitancy [ ]  Discharge [ ]  Neuro: Headaches[ ]  Vertigo[ ]  Paresthesias[ ]  Spasm [ ]  Speech changes [ ]  Incoordination [ ]   Ortho: Arthritis [ ]  Joint pain [ ]  Muscle pain Arly.Keller[X ] Joint swelling [ ]  Back Pain Arly.Keller[X ] Skin:  Rash [ ]   Pruritis [ ]  Change in skin lesion [ ]   Psych: Depression[ ]  Anxiety[ ]  Confusion [ ]  Memory loss [ ]   Heme/Lypmh: Bleeding [ ]  Bruising [ ]  Enlarged lymph nodes [ ]   Endocrine: Visual blurring [ ]  Paresthesia [ ]  Polyuria [ ]  Polydypsea [ ]    Heat/cold intolerance [ ]  Hypoglycemia [ ]   Family history- Review and unchanged Social history- Review and unchanged Physical Exam: BP 110/78  Pulse 76  Temp(Src) 98.2 F (36.8 C)  Resp 16  Ht 5' 6.5" (1.689 m)  Wt 185 lb (83.915 kg)  BMI 29.42 kg/m2 Wt Readings from Last 3 Encounters:  02/13/14 185 lb (83.915 kg)  12/18/13 188 lb (85.276 kg)  11/14/13 184 lb (83.462 kg)   General Appearance: Well nourished, in no apparent distress. Eyes: PERRLA, EOMs, conjunctiva no swelling or erythema Sinuses: No Frontal/maxillary tenderness ENT/Mouth: Ext aud canals clear, TMs without erythema, bulging. No erythema, swelling, or exudate on post pharynx.  Tonsils not swollen or erythematous. Hearing normal.  Neck: Supple, thyroid normal.  Respiratory: Respiratory effort normal, BS equal bilaterally without rales, rhonchi, wheezing or stridor.  Cardio: RRR  with no MRGs. Brisk peripheral pulses without edema.  Abdomen: Soft, + BS.  Non tender, no guarding, rebound, hernias, masses. Lymphatics: Non tender without lymphadenopathy.  Musculoskeletal: Full ROM, 5/5 strength, normal gait. + right posterior paraspinal muscle spasm, neg leg raise, no rash.  Skin: Warm, dry without rashes, lesions, ecchymosis.  Neuro: Cranial nerves intact. No cerebellar symptoms. Sensation intact.  Psych: Awake and oriented X 3, normal affect, Insight and Judgment appropriate.    Steffanie Dunnollier,  Amanda 11:24 AM

## 2014-02-13 NOTE — Progress Notes (Deleted)
   Subjective:    Patient ID: Allison [name], female    DOB: 03/21/1956, 58 y.o.   MRN: [id number]  HPI    Review of Systems     Objective:   Physical Exam        Assessment & Plan:

## 2014-02-13 NOTE — Patient Instructions (Signed)
   Bad carbs also include fruit juice, alcohol, and sweet tea. These are empty calories that do not signal to your brain that you are full.   Please remember the good carbs are still carbs which convert into sugar. So please measure them out no more than 1/2-1 cup of rice, oatmeal, pasta, and beans.  Veggies are however free foods! Pile them on.   I like lean protein at every meal such as chicken, Mal[place]urkey, pork chops, cottage cheese, etc. Just do not fry these meats and please center your meal around vegetable, the meats should be a side dish.   No all fruit is created equal. Please see the list below, the fruit at the bottom is higher in sugars than the fruit at the top   Back Exercises Back exercises help treat and prevent back injuries. The goal of back exercises is to increase the strength of your abdominal and back muscles and the flexibility of your back. These exercises should be started when you no longer have back pain. Back exercises include:  Pelvic Tilt. Lie on your back with your knees bent. Tilt your pelvis until the lower part of your back is against the floor. Hold this position 5 to 10 sec and repeat 5 to 10 times.  Knee to Chest. Pull first 1 knee up against your chest and hold for 20 to 30 seconds, repeat this with the other knee, and then both knees. This may be done with the other leg straight or bent, whichever feels better.  Sit-Ups or Curl-Ups. Bend your knees 90 degrees. Start with tilting your pelvis, and do a partial, slow sit-up, lifting your trunk only 30 to 45 degrees off the floor. Take at least 2 to 3 seconds for each sit-up. Do not do sit-ups with your knees out straight. If partial sit-ups are difficult, simply do the above but with only tightening your abdominal muscles and holding it as directed.  Hip-Lift. Lie on your back with your knees flexed 90 degrees. Push down with your feet and shoulders as you raise your hips a couple inches off the floor; hold for 10  seconds, repeat 5 to 10 times.  Back arches. Lie on your stomach, propping yourself up on bent elbows. Slowly press on your hands, causing an arch in your low back. Repeat 3 to 5 times. Any initial stiffness and discomfort should lessen with repetition over time.  Shoulder-Lifts. Lie face down with arms beside your body. Keep hips and torso pressed to floor as you slowly lift your head and shoulders off the floor. Do not overdo your exercises, especially in the beginning. Exercises may cause you some mild back discomfort which lasts for a few minutes; however, if the pain is more severe, or lasts for more than 15 minutes, do not continue exercises until you see your caregiver. Improvement with exercise therapy for back problems is slow.  See your caregivers for assistance with developing a proper back exercise program. Document Released: 09/03/2004 Document Revised: 10/19/2011 Document Reviewed: 05/28/2011 [facility] Patient Information 2015 [place]astonExitCare, [place]. This information is not intended to replace advice given to you by your health care provider. Make sure you discuss any questions you have with your health care provider.

## 2014-03-01 ENCOUNTER — Other Ambulatory Visit: Payer: Self-pay | Admitting: Internal Medicine

## 2014-03-10 ENCOUNTER — Emergency Department (HOSPITAL_COMMUNITY)
Admission: EM | Admit: 2014-03-10 | Discharge: 2014-03-10 | Disposition: A | Discharge status: Home / Self Care | Payer: Medicare HMO | Attending: Emergency Medicine | Admitting: Emergency Medicine

## 2014-03-10 ENCOUNTER — Encounter (HOSPITAL_COMMUNITY): Payer: Self-pay | Admitting: Emergency Medicine

## 2014-03-10 DIAGNOSIS — G8929 Other chronic pain: Secondary | ICD-10-CM | POA: Insufficient documentation

## 2014-03-10 DIAGNOSIS — Y9289 Other specified places as the place of occurrence of the external cause: Secondary | ICD-10-CM | POA: Diagnosis not present

## 2014-03-10 DIAGNOSIS — F17200 Nicotine dependence, unspecified, uncomplicated: Secondary | ICD-10-CM | POA: Insufficient documentation

## 2014-03-10 DIAGNOSIS — Z79899 Other long term (current) drug therapy: Secondary | ICD-10-CM | POA: Insufficient documentation

## 2014-03-10 DIAGNOSIS — Y939 Activity, unspecified: Secondary | ICD-10-CM | POA: Insufficient documentation

## 2014-03-10 DIAGNOSIS — I129 Hypertensive chronic kidney disease with stage 1 through stage 4 chronic kidney disease, or unspecified chronic kidney disease: Secondary | ICD-10-CM | POA: Diagnosis not present

## 2014-03-10 DIAGNOSIS — Z7982 Long term (current) use of aspirin: Secondary | ICD-10-CM | POA: Insufficient documentation

## 2014-03-10 DIAGNOSIS — N182 Chronic kidney disease, stage 2 (mild): Secondary | ICD-10-CM | POA: Diagnosis not present

## 2014-03-10 DIAGNOSIS — E785 Hyperlipidemia, unspecified: Secondary | ICD-10-CM | POA: Diagnosis not present

## 2014-03-10 DIAGNOSIS — E1129 Type 2 diabetes mellitus with other diabetic kidney complication: Secondary | ICD-10-CM | POA: Diagnosis not present

## 2014-03-10 DIAGNOSIS — T401X4A Poisoning by heroin, undetermined, initial encounter: Secondary | ICD-10-CM | POA: Diagnosis not present

## 2014-03-10 DIAGNOSIS — F3189 Other bipolar disorder: Secondary | ICD-10-CM | POA: Insufficient documentation

## 2014-03-10 DIAGNOSIS — M159 Polyosteoarthritis, unspecified: Secondary | ICD-10-CM | POA: Diagnosis not present

## 2014-03-10 DIAGNOSIS — T401X1A Poisoning by heroin, accidental (unintentional), initial encounter: Secondary | ICD-10-CM | POA: Diagnosis not present

## 2014-03-10 LAB — CBC WITH DIFFERENTIAL/PLATELET
Basophils Absolute: 0 10*3/uL (ref 0.0–0.1)
Basophils Relative: 0 % (ref 0–1)
Eosinophils Absolute: 0.3 10*3/uL (ref 0.0–0.7)
Eosinophils Relative: 2 % (ref 0–5)
HCT: 39.7 % (ref 36.0–46.0)
Hemoglobin: 13.3 g/dL (ref 12.0–15.0)
Lymphocytes Relative: 14 % (ref 12–46)
Lymphs Abs: 2.2 10*3/uL (ref 0.7–4.0)
MCH: 29.2 pg (ref 26.0–34.0)
MCHC: 33.5 g/dL (ref 30.0–36.0)
MCV: 87.3 fL (ref 78.0–100.0)
Monocytes Absolute: 1 10*3/uL (ref 0.1–1.0)
Monocytes Relative: 6 % (ref 3–12)
Neutro Abs: 11.9 10*3/uL — ABNORMAL HIGH (ref 1.7–7.7)
Neutrophils Relative %: 78 % — ABNORMAL HIGH (ref 43–77)
Platelets: 242 10*3/uL (ref 150–400)
RBC: 4.55 MIL/uL (ref 3.87–5.11)
RDW: 13.4 % (ref 11.5–15.5)
WBC: 15.4 10*3/uL — ABNORMAL HIGH (ref 4.0–10.5)

## 2014-03-10 LAB — BASIC METABOLIC PANEL
Anion gap: 14 (ref 5–15)
BUN: 20 mg/dL (ref 6–23)
CO2: 28 mEq/L (ref 19–32)
Calcium: 9.3 mg/dL (ref 8.4–10.5)
Chloride: 101 mEq/L (ref 96–112)
Creatinine, Ser: 1.14 mg/dL — ABNORMAL HIGH (ref 0.50–1.10)
GFR calc Af Amer: 61 mL/min — ABNORMAL LOW (ref >90)
GFR calc non Af Amer: 52 mL/min — ABNORMAL LOW (ref >90)
Glucose, Bld: 198 mg/dL — ABNORMAL HIGH (ref 70–99)
Potassium: 3.8 mEq/L (ref 3.7–5.3)
Sodium: 143 mEq/L (ref 137–147)

## 2014-03-10 LAB — CBG MONITORING, ED: Glucose-Capillary: 212 mg/dL — ABNORMAL HIGH (ref 70–99)

## 2014-03-10 MED ORDER — NALOXONE HCL 1 MG/ML IJ SOLN: 1.0000 mg | INTRAMUSCULAR | Status: DC | PRN

## 2014-03-10 NOTE — Discharge Instructions (Signed)
Accidental Overdose  A drug overdose occurs when a chemical substance (drug or medication) is used in amounts large enough to overcome a person. This may result in severe illness or death. This is a type of poisoning. Accidental overdoses of medications or other substances come from a variety of reasons. When this happens accidentally, it is often because the person taking the substance does not know enough about what they have taken. Drugs which commonly cause overdose deaths are alcohol, psychotropic medications (medications which affect the mind), pain medications, illegal drugs (street drugs) such as cocaine and heroin, and multiple drugs taken at the same time. It may result from careless behavior (such as over-indulging at a party). Other causes of overdose may include multiple drug use, a lapse in memory, or drug use after a period of no drug use.   Sometimes overdosing occurs because a person cannot remember if they have taken their medication.   A common unintentional overdose in young children involves multi-vitamins containing iron. Iron is a part of the hemoglobin molecule in blood. It is used to transport oxygen to living cells. When taken in small amounts, iron allows the body to restock hemoglobin. In large amounts, it causes problems in the body. If this overdose is not treated, it can lead to death.  Never take medicines that show signs of tampering or do not seem quite right. Never take medicines in the dark or in poor lighting. Read the label and check each dose of medicine before you take it. When adults are poisoned, it happens most often through carelessness or lack of information. Taking medicines in the dark or taking medicine prescribed for someone else to treat the same type of problem is a dangerous practice.  SYMPTOMS   Symptoms of overdose depend on the medication and amount taken. They can vary from over-activity with stimulant over-dosage, to sleepiness from depressants such as  alcohol, narcotics and tranquilizers. Confusion, dizziness, nausea and vomiting may be present. If problems are severe enough coma and death may result.  DIAGNOSIS   Diagnosis and management are generally straightforward if the drug is known. Otherwise it is more difficult. At times, certain symptoms and signs exhibited by the patient, or blood tests, can reveal the drug in question.   TREATMENT   In an emergency department, most patients can be treated with supportive measures. Antidotes may be available if there has been an overdose of opioids or benzodiazepines. A rapid improvement will often occur if this is the cause of overdose.  At home or away from medical care:   There may be no immediate problems or warning signs in children.   Not everything works well in all cases of poisoning.   Take immediate action. Poisons may act quickly.   If you think someone has swallowed medicine or a household product, and the person is unconscious, having seizures (convulsions), or is not breathing, immediately call for an ambulance.  IF a person is conscious and appears to be doing OK but has swallowed a poison:   Do not wait to see what effect the poison will have. Immediately call a poison control center (listed in the white pages of your telephone book under "Poison Control" or inside the front cover with other emergency numbers). Some poison control centers have TTY capability for the deaf. Check with your local center if you or someone in your family requires this service.   Keep the container so you can read the label on the product for ingredients.     Describe what, when, and how much was taken and the age and condition of the person poisoned. Inform them if the person is vomiting, choking, drowsy, shows a change in color or temperature of skin, is conscious or unconscious, or is convulsing.   Do not cause vomiting unless instructed by medical personnel. Do not induce vomiting or force liquids into a person who  is convulsing, unconscious, or very drowsy.  Stay calm and in control.    Activated charcoal also is sometimes used in certain types of poisoning and you may wish to add a supply to your emergency medicines. It is available without a prescription. Call a poison control center before using this medication.  PREVENTION   Thousands of children die every year from unintentional poisoning. This may be from household chemicals, poisoning from carbon monoxide in a car, taking their parent's medications, or simply taking a few iron pills or vitamins with iron. Poisoning comes from unexpected sources.   Store medicines out of the sight and reach of children, preferably in a locked cabinet. Do not keep medications in a food cabinet. Always store your medicines in a secure place. Get rid of expired medications.   If you have children living with you or have them as occasional guests, you should have child-resistant caps on your medicine containers. Keep everything out of reach. Child proof your home.   If you are called to the telephone or to answer the door while you are taking a medicine, take the container with you or put the medicine out of the reach of small children.   Do not take your medication in front of children. Do not tell your child how good a medication is and how good it is for them. They may get the idea it is more of a treat.   If you are an adult and have accidentally taken an overdose, you need to consider how this happened and what can be done to prevent it from happening again. If this was from a street drug or alcohol, determine if there is a problem that needs addressing. If you are not sure a problems exists, it is easy to talk to a professional and ask them if they think you have a problem. It is better to handle this problem in this way before it happens again and has a much worse consequence.  Document Released: 10/10/2004 Document Revised: 10/19/2011 Document Reviewed: 03/18/2009  [employer]  Patient Information 2015 [employer]. This information is not intended to replace advice given to you by your health care provider. Make sure you discuss any questions you have with your health care provider.

## 2014-03-10 NOTE — ED Notes (Signed)
Per EMS pt ws found unresponsive by family  They put her in the bathtub and gave her rescue breaths until EMS arrived  EMS found pt breathing about 4 times per minute  Pt was bagged and IV was established  Narcan 2mg  given and zofran 4mg  given  Pt became less responsive during transport so Narcan 1 mg was given  On scene a syringe was found  Pt states she used heroin for the first time tonight  Pt denies pain  Pt is alert and oriented x 4 upon arrival  Pt states she used heroin tonight to help her sleep  Pt on oxygen at 4 liters/min via [place] upon arrival

## 2014-03-10 NOTE — ED Provider Notes (Signed)
CSN: [id number]     Arrival date & time 03/10/14  0536 History   First MD Initiated Contact with Patient 03/10/14 0542     Chief Complaint  Patient presents with  . Drug Overdose     (Consider location/radiation/quality/duration/timing/severity/associated sxs/prior Treatment) HPI  58 year old female brought in by EMS. Found at home poorly responsive by family.. Have agonal breathing. Rescue breathing was in by family members. On EMS arrival patient was breathing approximately 4 times per minute. Initially bagged and then given Narcan after an IV was established. Became much more arousable quickly after this. Syringe was found on scene. The patient admits to air when usage. She says this is her first time ever using it. She reports that she was just using it to help her sleep. She denies to try to hurt herself. She denies any coingestions.   Past Medical History  Diagnosis Date  . Hypertension   . Hyperlipidemia   . Chronic lumbar pain   . 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 Generalized osteoarthrosis, involving multiple sites   . Acute drug-induced gout of right ankle   . Acute gout due to renal impairment involving ankle   . 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 Abdominal hysterectomy  1979  . Oophorectomy  1979  . Breast surgery Bilateral 1986    gel implants    No family history on file. History  Substance Use Topics  . Smoking status: Current Every Day Smoker -- 1.00 packs/day    Types: Cigarettes  . Smokeless tobacco: Not on file  . Alcohol Use: No   OB History   Grav Para Term Preterm Abortions TAB SAB Ect Mult Living                 Review of Systems  All systems reviewed and negative, other than as noted in HPI.   Allergies  Review of patient's allergies indicates no known allergies.  Home Medications   Prior to Admission medications   Medication  Sig Start Date End Date Taking? Authorizing Provider  aspirin 81 MG chewable tablet Chew 81 mg by mouth daily.    Yes Historical Provider, MD  Cholecalciferol (VITAMIN D PO) Take 8,000 Units by mouth daily.    Yes Historical Provider, MD  clonazePAM (KLONOPIN) 0.5 MG tablet Take 0.5 mg by mouth 3 (three) times daily as needed for anxiety.   Yes Historical Provider, MD  cyclobenzaprine (FLEXERIL) 10 MG tablet Take 1 tablet (10 mg total) by mouth every 8 (eight) hours as needed for muscle spasms. 02/13/14 02/13/15 Yes Quentin MullingAmanda Collier, PA-C  glipiZIDE (GLUCOTROL) 10 MG tablet Take 5 mg by mouth 3 (three) times daily before meals.   Yes Historical Provider, MD  lisinopril-hydrochlorothiazide (PRINZIDE,ZESTORETIC) 20-12.5 MG per tablet TAKE ONE TABLET BY MOUTH ONCE DAILY IN THE MORNING FOR  BLOOD  PRESSURE 02/13/14  Yes Lucky CowboyWilliam McKeown, MD  pregabalin (LYRICA) 100 MG capsule Take 1 capsule (100 mg total) by mouth 2 (two) times daily. 02/13/14 02/13/15 Yes Quentin MullingAmanda Collier, PA-C  QUEtiapine (SEROQUEL XR) 50 MG TB24 24 hr tablet Take 50 mg by mouth daily.   Yes Historical Provider, MD  carbamazepine (TEGRETOL) 200 MG tablet Take 200 mg by mouth daily.  08/02/13   Historical Provider, MD  FARXIGA 10 MG TABS TAKE 1 TABLET BY MOUTH EVERY DAY 03/01/14   Lucky CowboyWilliam McKeown, MD  lisinopril-hydrochlorothiazide [facility](PRINZIDE,ZESTORETIC) 20-12.5  MG per tablet Take 1 tablet by mouth daily.  11/02/13   Historical Provider, MD  metFORMIN (GLUCOPHAGE-XR) 500 MG 24 hr tablet TAKE ONE TABLET BY MOUTH WITH BREAKFAST AND LUNCH AND TWO TABLETS WITH SUPPER FOR DIABETES    Melissa R Smith, PA-C  pravastatin (PRAVACHOL) 40 MG tablet Take 40 mg by mouth daily.    Historical Provider, MD   BP 92/78  Pulse 94  Temp(Src) 97.4 F (36.3 C) (Oral)  Resp 16  Ht 5\' 6"  (1.676 m)  Wt 180 lb (81.647 kg)  BMI 29.07 kg/m2  SpO2 99% Physical Exam  Nursing note and vitals reviewed. Constitutional: She appears well-developed and well-nourished. No  distress.  HENT:  Head: Normocephalic and atraumatic.  Eyes: Conjunctivae are normal. Right eye exhibits no discharge. Left eye exhibits no discharge.  Neck: Neck supple.  Cardiovascular: Normal rate, regular rhythm and normal heart sounds.  Exam reveals no gallop and no friction rub.   No murmur heard. Pulmonary/Chest: Effort normal and breath sounds normal. No respiratory distress.  Abdominal: Soft. She exhibits no distension. There is no tenderness.  Musculoskeletal: She exhibits no edema and no tenderness.  Neurological: She is alert.  Skin: Skin is warm and dry.  Psychiatric: She has a normal mood and affect. Her behavior is normal. Thought content normal.  Drowsy but sitting in bed with eyes open. Speech clear. Content appropriate. Follows commands.     ED Course  Procedures (including critical care time) Labs Review Labs Reviewed  BASIC METABOLIC PANEL - Abnormal; Notable for the following:    Glucose, Bld 198 (*)    Creatinine, Ser 1.14 (*)    GFR calc non Af Amer 52 (*)    GFR calc Af Amer 61 (*)    All other components within normal limits  CBC WITH DIFFERENTIAL - Abnormal; Notable for the following:    WBC 15.4 (*)    Neutrophils Relative % 78 (*)    Neutro Abs 11.9 (*)    All other components within normal limits    Imaging Review No results found.   EKG Interpretation   Date/Time:  Saturday March 10 2014 05:44:07 EDT Ventricular Rate:  96 PR Interval:  153 QRS Duration: 82 QT Interval:  374 QTC Calculation: 473 R Axis:   24 Text Interpretation:  Sinus rhythm Low voltage, precordial leads No old  tracing to compare Confirmed by Jermiyah Ricotta  MD, Jamien Casanova ([id number]) on 03/10/2014  7:51:33 AM      MDM   Final diagnoses:  Heroin overdose, accidental or unintentional, initial encounter    58 year old female with unintentional heroin overdose. Requiring Narcan. Arrived alert and appropriate. During her observation she has become mildly drowsy, but awakens to voice  and responding to questions. We'll continue to observe.    Raeford Razor, MD 03/12/14 1235

## 2014-03-10 NOTE — ED Notes (Signed)
IV flushed and patent.  

## 2014-03-10 NOTE — ED Notes (Signed)
Pt unable to ambulate.  Dr. Juleen ChinaKohut informed.

## 2014-03-10 NOTE — ED Notes (Signed)
Pt sitting up eating lunch as she attempts to contact family to pick her up.

## 2014-03-10 NOTE — ED Notes (Signed)
EKG given to EDP Kohut for review 

## 2014-03-10 NOTE — ED Provider Notes (Signed)
8:13 AM Accepted care from Dr. Juleen ChinaKohut. 24F here w/ unintentional heroin od. Pt resting comfortably on exam. Remains stable. Will cont to monitor w/ likely plan to d/c.   1:40 PM: Pt now much more alert. Ate lunch. Is ambulatory. Friend here to pick her up. I discussed the administration of narcan and gave her a Rx for this to be used in emergency situations.  I have discussed the diagnosis/risks/treatment options with the patient and friend and believe the pt to be eligible for discharge home to follow-up with pcp as needed. We also discussed returning to the ED immediately if new or worsening sx occur. We discussed the sx which are most concerning (e.g., AMS, fever) that necessitate immediate return. Medications administered to the patient during their visit and any new prescriptions provided to the patient are listed below.  Medications given during this visit Medications - No data to display  New Prescriptions   NALOXONE (NARCAN) 1 MG/ML INJECTION    Inject 1 mL (1 mg total) into the muscle as needed.   Clinical Impression 1. Heroin overdose, accidental or unintentional, initial encounter      Junius ArgyleForrest S Teigen Bellin, MD 03/10/14 1530

## 2014-03-10 NOTE — ED Notes (Signed)
Pt came in with heroin overdose  When asked pt what happened tonight she states she is not going to talk about it   Pt states she feel fine

## 2014-03-11 NOTE — ED Notes (Signed)
Late entry:  Pt called a friend to come get her.  With patient's permission, Dr. Romeo AppleHarrison discussed the Narcan prescription with patient and friend.  Patient was ambulatory prior to discharge and ate a lunch tray and drank orange juice and a can of orange Gatorade before leaving.

## 2014-03-14 ENCOUNTER — Other Ambulatory Visit: Payer: Self-pay | Admitting: Physician Assistant

## 2014-03-14 ENCOUNTER — Other Ambulatory Visit: Payer: Self-pay | Admitting: Emergency Medicine

## 2014-04-16 ENCOUNTER — Other Ambulatory Visit: Payer: Self-pay | Admitting: Physician Assistant

## 2014-04-19 ENCOUNTER — Other Ambulatory Visit: Payer: Self-pay | Admitting: Internal Medicine

## 2014-04-19 ENCOUNTER — Other Ambulatory Visit: Payer: Self-pay | Admitting: Physician Assistant

## 2014-05-16 ENCOUNTER — Ambulatory Visit: Payer: Commercial Managed Care - HMO | Admitting: Internal Medicine

## 2014-05-16 DIAGNOSIS — Z79899 Other long term (current) drug therapy: Secondary | ICD-10-CM | POA: Insufficient documentation

## 2014-05-16 NOTE — Progress Notes (Signed)
Patient ID: Allison GuildSheri [name], female   DOB: 09/10/1955, 58 y.o.   MRN: [id number]  Allison [name] O    S H O W

## 2014-05-26 ENCOUNTER — Other Ambulatory Visit: Payer: Self-pay | Admitting: Internal Medicine

## 2014-05-26 ENCOUNTER — Other Ambulatory Visit: Payer: Self-pay | Admitting: Physician Assistant

## 2014-05-28 ENCOUNTER — Ambulatory Visit: Payer: Self-pay | Admitting: Internal Medicine

## 2014-05-28 ENCOUNTER — Other Ambulatory Visit: Payer: Self-pay | Admitting: Physician Assistant

## 2014-06-19 ENCOUNTER — Other Ambulatory Visit: Payer: Self-pay | Admitting: Internal Medicine

## 2014-07-17 ENCOUNTER — Other Ambulatory Visit: Payer: Self-pay | Admitting: Physician Assistant

## 2014-07-31 ENCOUNTER — Encounter: Payer: Self-pay | Admitting: Internal Medicine

## 2014-07-31 ENCOUNTER — Ambulatory Visit (INDEPENDENT_AMBULATORY_CARE_PROVIDER_SITE_OTHER): Payer: Commercial Managed Care - HMO | Admitting: Internal Medicine

## 2014-07-31 VITALS — BP 86/56 | HR 88 | Temp 97.7°F | Resp 16 | Ht 66.5 in | Wt 186.8 lb

## 2014-07-31 DIAGNOSIS — I10 Essential (primary) hypertension: Secondary | ICD-10-CM

## 2014-07-31 DIAGNOSIS — E785 Hyperlipidemia, unspecified: Secondary | ICD-10-CM

## 2014-07-31 DIAGNOSIS — E119 Type 2 diabetes mellitus without complications: Secondary | ICD-10-CM

## 2014-07-31 DIAGNOSIS — Z79899 Other long term (current) drug therapy: Secondary | ICD-10-CM

## 2014-07-31 DIAGNOSIS — E559 Vitamin D deficiency, unspecified: Secondary | ICD-10-CM

## 2014-07-31 NOTE — Progress Notes (Signed)
Patient ID: Allison [name], female   DOB: 12/11/1955, 58 y.o.   MRN: [id number]   This very nice 58 y.o.female presents for 3 month follow up with Hypertension, Hyperlipidemia, Pre-Diabetes and Vitamin D Deficiency.  Otgher problems include bipolar disorder which apparently is stabilized at present.   Patient is treated for HTN (1998) & BP has been controlled at home. Today's BP was  86/56  By the Nurse & rechecked by myself at 106/72. Patient has had no complaints of any cardiac type chest pain, palpitations, dyspnea/orthopnea/PND, dizziness, claudication, or dependent edema.   Hyperlipidemia is controlled with diet & meds. Patient denies myalgias or other med SE's. Last Lipids were Total Chol 150; HDL  43; LDL  63; Trig 221 on 11/02/2013.   Also, the patient has history of T2_NIDDM and has been poorly compliant with diet & reports random CBG's - both fasting & immediately pc have ranged betw 150 - 300 mg% and has had no symptoms of reactive hypoglycemia, diabetic polys, paresthesias or visual blurring.  Last A1c was  12.9% 12/18/2013.    Further, the patient also has history of Vitamin D Deficiency of 26 in 2012 and supplements vitamin D without any suspected side-effects. Last vitamin D was  33 on 11/02/2013.   Medication List   aspirin 81 MG chewable tablet  Chew 81 mg by mouth daily.     busPIRone 10 MG tablet  Commonly known as:  BUSPAR  10 mg daily.     carbamazepine 200 MG tablet  Commonly known as:  TEGRETOL  Take 200 mg by mouth daily.     clonazePAM 0.5 MG tablet  Commonly known as:  KLONOPIN  Take 0.5 mg by mouth 3 (three) times daily as needed for anxiety.     cyclobenzaprine 10 MG tablet  Commonly known as:  FLEXERIL  TAKE ONE TABLET BY MOUTH EVERY 8 HOURS AS NEEDED FOR MUSCLE SPASM     glipiZIDE 10 MG tablet  Commonly known as:  GLUCOTROL  Take 5 mg by mouth 3 (three) times daily before meals.     lisinopril-hydrochlorothiazide 20-12.5 MG per tablet  Commonly known as:   PRINZIDE,ZESTORETIC  TAKE ONE TABLET BY MOUTH IN THE MORNING FOR  BLOOD  PRESSURE     LYRICA 100 MG capsule  Generic drug:  pregabalin  TAKE ONE CAPSULE BY MOUTH TWICE DAILY     meloxicam 15 MG tablet  Commonly known as:  MOBIC  TAKE ONE TABLET BY MOUTH ONCE DAILY AS NEEDED FOR PAIN AND  FOR  INFLAMMATION     metFORMIN 500 MG 24 hr tablet  Commonly known as:  GLUCOPHAGE-XR  TAKE ONE TABLET BY MOUTH WITH BREAKFAST AND LUNCH AND TWO TABLETS BY MOUTH WITH SUPPER FOR DIABETES     pravastatin 40 MG tablet  Commonly known as:  PRAVACHOL  Take 40 mg by mouth daily.     QUEtiapine 50 MG Tb24 24 hr tablet  Commonly known as:  SEROQUEL XR  Take 50 mg by mouth daily.     VITAMIN D PO  Take 8,000 Units by mouth daily.     No Known Allergies  PMHx:   Past Medical History  Diagnosis Date  . Hypertension   . Hyperlipidemia   . Chronic lumbar pain   . 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 Generalized osteoarthrosis, involving multiple sites   . Acute drug-induced gout of right ankle   . Acute gout due to renal impairment  involving ankle   . 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 DT 08/10/2004  . Influenza Split 06/08/2013   Past Surgical History  Procedure Laterality Date  . Abdominal hysterectomy  1979  . Oophorectomy  1979  . Breast surgery Bilateral 1986    gel implants    FHx:    Reviewed / unchanged  SHx:    Reviewed / unchanged  Systems Review:  Constitutional: Denies fever, chills, wt changes, headaches, insomnia, fatigue, night sweats, change in appetite. Eyes: Denies redness, blurred vision, diplopia, discharge, itchy, watery eyes.  ENT: Denies discharge, congestion, post nasal drip, epistaxis, sore throat, earache, hearing loss, dental pain, tinnitus, vertigo, sinus pain, snoring.  CV: Denies chest pain, palpitations, irregular heartbeat, syncope,  dyspnea, diaphoresis, orthopnea, PND, claudication or edema. Respiratory: denies cough, dyspnea, DOE, pleurisy, hoarseness, laryngitis, wheezing.  Gastrointestinal: Denies dysphagia, odynophagia, heartburn, reflux, water brash, abdominal pain or cramps, nausea, vomiting, bloating, diarrhea, constipation, hematemesis, melena, hematochezia  or hemorrhoids. Genitourinary: Denies dysuria, frequency, urgency, nocturia, hesitancy, discharge, hematuria or flank pain. Musculoskeletal: Denies arthralgias, myalgias, stiffness, jt. swelling, pain, limping or strain/sprain.  Skin: Denies pruritus, rash, hives, warts, acne, eczema or change in skin lesion(s). Neuro: No weakness, tremor, incoordination, spasms, paresthesia or pain. Psychiatric: Denies confusion, memory loss or sensory loss. Endo: Denies change in weight, skin or hair change.  Heme/Lymph: No excessive bleeding, bruising or enlarged lymph nodes.  Physical Exam  BP 86/56   Pulse 88  Temp 97.7 F   Resp 16  Ht 5' 6.5"   Wt 186 lb 12.8 oz   BMI 29.70  BP rechecked by myself @ 106/72 / P 76  Appears well nourished and in no distress. Eyes: PERRLA, EOMs, conjunctiva no swelling or erythema. Sinuses: No frontal/maxillary tenderness ENT/Mouth: EAC's clear, TM's nl w/o erythema, bulging. Nares clear w/o erythema, swelling, exudates. Oropharynx clear without erythema or exudates. Oral hygiene is good. Tongue normal, non obstructing. Hearing intact.  Neck: Supple. Thyroid nl. Car 2+/2+ without bruits, nodes or JVD. Chest: Respirations nl with BS clear & equal w/o rales, rhonchi, wheezing or stridor.  Cor: Heart sounds normal w/ regular rate and rhythm without sig. murmurs, gallops, clicks, or rubs. Peripheral pulses normal and equal  without edema.  Abdomen: Soft & bowel sounds normal. Non-tender w/o guarding, rebound, hernias, masses, or organomegaly.  Lymphatics: Unremarkable.  Musculoskeletal: Full ROM all peripheral extremities, joint  stability, 5/5 strength, and normal gait.  Skin: Warm, dry without exposed rashes, lesions or ecchymosis apparent.  Neuro: Cranial nerves intact, reflexes equal bilaterally. Sensory-motor testing grossly intact. Tendon reflexes grossly intact.  Pysch: Alert & oriented x 3.  Insight and judgement nl & appropriate. No ideations.  Assessment and Plan:  1. Hypertension - Continue monitor blood pressure at home. Continue diet/meds same.  2. Hyperlipidemia - Continue diet/meds, exercise,& lifestyle modifications. Continue monitor periodic cholesterol/liver & renal functions   3. T2_NIDDM - Continue diet, exercise, lifestyle modifications. Monitor appropriate labs.  4. Vitamin D Deficiency - Continue supplementation.   Recommended regular exercise, BP monitoring, weight control, and discussed med and SE's. Recommended labs to assess and monitor clinical status. Further disposition pending results of labs.

## 2014-07-31 NOTE — Patient Instructions (Signed)

## 2014-08-01 LAB — HEPATIC FUNCTION PANEL
ALT: 52 U/L — AB (ref 0–35)
AST: 37 U/L (ref 0–37)
Albumin: 3.8 g/dL (ref 3.5–5.2)
Alkaline Phosphatase: 96 U/L (ref 39–117)
BILIRUBIN INDIRECT: 0.4 mg/dL (ref 0.2–1.2)
Bilirubin, Direct: 0.1 mg/dL (ref 0.0–0.3)
TOTAL PROTEIN: 6.5 g/dL (ref 6.0–8.3)
Total Bilirubin: 0.5 mg/dL (ref 0.2–1.2)

## 2014-08-01 LAB — CBC WITH DIFFERENTIAL/PLATELET
Basophils Absolute: 0.1 10*3/uL (ref 0.0–0.1)
Basophils Relative: 1 % (ref 0–1)
EOS PCT: 4 % (ref 0–5)
Eosinophils Absolute: 0.3 10*3/uL (ref 0.0–0.7)
HEMATOCRIT: 43.9 % (ref 36.0–46.0)
Hemoglobin: 14.5 g/dL (ref 12.0–15.0)
Lymphocytes Relative: 36 % (ref 12–46)
Lymphs Abs: 2.9 10*3/uL (ref 0.7–4.0)
MCH: 28.9 pg (ref 26.0–34.0)
MCHC: 33 g/dL (ref 30.0–36.0)
MCV: 87.5 fL (ref 78.0–100.0)
MONO ABS: 0.7 10*3/uL (ref 0.1–1.0)
MONOS PCT: 9 % (ref 3–12)
MPV: 9.8 fL (ref 9.4–12.4)
NEUTROS ABS: 4.1 10*3/uL (ref 1.7–7.7)
NEUTROS PCT: 50 % (ref 43–77)
PLATELETS: 272 10*3/uL (ref 150–400)
RBC: 5.02 MIL/uL (ref 3.87–5.11)
RDW: 14.6 % (ref 11.5–15.5)
WBC: 8.1 10*3/uL (ref 4.0–10.5)

## 2014-08-01 LAB — BASIC METABOLIC PANEL WITH GFR
BUN: 22 mg/dL (ref 6–23)
CHLORIDE: 93 meq/L — AB (ref 96–112)
CO2: 29 meq/L (ref 19–32)
CREATININE: 2 mg/dL — AB (ref 0.50–1.10)
Calcium: 9.5 mg/dL (ref 8.4–10.5)
GFR, Est African American: 31 mL/min — ABNORMAL LOW
GFR, Est Non African American: 27 mL/min — ABNORMAL LOW
GLUCOSE: 450 mg/dL — AB (ref 70–99)
Potassium: 3.7 mEq/L (ref 3.5–5.3)
Sodium: 133 mEq/L — ABNORMAL LOW (ref 135–145)

## 2014-08-01 LAB — HEMOGLOBIN A1C
Hgb A1c MFr Bld: 10.9 % — ABNORMAL HIGH (ref <5.7)
Mean Plasma Glucose: 266 mg/dL — ABNORMAL HIGH (ref <117)

## 2014-08-01 LAB — LIPID PANEL
Cholesterol: 163 mg/dL (ref 0–200)
HDL: 32 mg/dL — AB (ref >39)
LDL CALC: 63 mg/dL (ref 0–99)
Total CHOL/HDL Ratio: 5.1 Ratio
Triglycerides: 339 mg/dL — ABNORMAL HIGH (ref <150)
VLDL: 68 mg/dL — ABNORMAL HIGH (ref 0–40)

## 2014-08-01 LAB — VITAMIN D 25 HYDROXY (VIT D DEFICIENCY, FRACTURES): Vit D, 25-Hydroxy: 28 ng/mL — ABNORMAL LOW (ref 30–100)

## 2014-08-01 LAB — INSULIN, FASTING: INSULIN FASTING, SERUM: 19.3 u[IU]/mL (ref 2.0–19.6)

## 2014-08-01 LAB — TSH: TSH: 1.064 u[IU]/mL (ref 0.350–4.500)

## 2014-08-01 LAB — MAGNESIUM: Magnesium: 2.1 mg/dL (ref 1.5–2.5)

## 2014-08-04 ENCOUNTER — Encounter (HOSPITAL_COMMUNITY): Payer: Self-pay | Admitting: Emergency Medicine

## 2014-08-04 ENCOUNTER — Emergency Department (HOSPITAL_COMMUNITY): Payer: Medicare HMO

## 2014-08-04 ENCOUNTER — Inpatient Hospital Stay (HOSPITAL_COMMUNITY)
Admission: EM | Admit: 2014-08-04 | Discharge: 2014-08-07 | DRG: 682 | Disposition: A | Discharge status: Home / Self Care | Payer: Medicare HMO | Attending: Internal Medicine | Admitting: Internal Medicine

## 2014-08-04 DIAGNOSIS — Z23 Encounter for immunization: Secondary | ICD-10-CM

## 2014-08-04 DIAGNOSIS — I129 Hypertensive chronic kidney disease with stage 1 through stage 4 chronic kidney disease, or unspecified chronic kidney disease: Secondary | ICD-10-CM | POA: Diagnosis present

## 2014-08-04 DIAGNOSIS — Z79899 Other long term (current) drug therapy: Secondary | ICD-10-CM

## 2014-08-04 DIAGNOSIS — L03115 Cellulitis of right lower limb: Secondary | ICD-10-CM | POA: Diagnosis present

## 2014-08-04 DIAGNOSIS — E876 Hypokalemia: Secondary | ICD-10-CM | POA: Diagnosis present

## 2014-08-04 DIAGNOSIS — S92341A Displaced fracture of fourth metatarsal bone, right foot, initial encounter for closed fracture: Secondary | ICD-10-CM | POA: Diagnosis present

## 2014-08-04 DIAGNOSIS — S92321A Displaced fracture of second metatarsal bone, right foot, initial encounter for closed fracture: Secondary | ICD-10-CM | POA: Diagnosis present

## 2014-08-04 DIAGNOSIS — G8929 Other chronic pain: Secondary | ICD-10-CM | POA: Diagnosis present

## 2014-08-04 DIAGNOSIS — T43591A Poisoning by other antipsychotics and neuroleptics, accidental (unintentional), initial encounter: Secondary | ICD-10-CM | POA: Diagnosis present

## 2014-08-04 DIAGNOSIS — F1110 Opioid abuse, uncomplicated: Secondary | ICD-10-CM | POA: Diagnosis present

## 2014-08-04 DIAGNOSIS — G9340 Encephalopathy, unspecified: Secondary | ICD-10-CM | POA: Diagnosis present

## 2014-08-04 DIAGNOSIS — T50901A Poisoning by unspecified drugs, medicaments and biological substances, accidental (unintentional), initial encounter: Secondary | ICD-10-CM | POA: Insufficient documentation

## 2014-08-04 DIAGNOSIS — N179 Acute kidney failure, unspecified: Principal | ICD-10-CM | POA: Diagnosis present

## 2014-08-04 DIAGNOSIS — F17210 Nicotine dependence, cigarettes, uncomplicated: Secondary | ICD-10-CM | POA: Diagnosis present

## 2014-08-04 DIAGNOSIS — E1140 Type 2 diabetes mellitus with diabetic neuropathy, unspecified: Secondary | ICD-10-CM | POA: Diagnosis present

## 2014-08-04 DIAGNOSIS — E119 Type 2 diabetes mellitus without complications: Secondary | ICD-10-CM

## 2014-08-04 DIAGNOSIS — F3181 Bipolar II disorder: Secondary | ICD-10-CM | POA: Diagnosis present

## 2014-08-04 DIAGNOSIS — S93326A Dislocation of tarsometatarsal joint of unspecified foot, initial encounter: Secondary | ICD-10-CM | POA: Diagnosis present

## 2014-08-04 DIAGNOSIS — Z7982 Long term (current) use of aspirin: Secondary | ICD-10-CM

## 2014-08-04 DIAGNOSIS — M79671 Pain in right foot: Secondary | ICD-10-CM | POA: Diagnosis not present

## 2014-08-04 DIAGNOSIS — I959 Hypotension, unspecified: Secondary | ICD-10-CM | POA: Diagnosis present

## 2014-08-04 DIAGNOSIS — S92351A Displaced fracture of fifth metatarsal bone, right foot, initial encounter for closed fracture: Secondary | ICD-10-CM | POA: Diagnosis present

## 2014-08-04 DIAGNOSIS — N182 Chronic kidney disease, stage 2 (mild): Secondary | ICD-10-CM | POA: Diagnosis present

## 2014-08-04 DIAGNOSIS — W010XXA Fall on same level from slipping, tripping and stumbling without subsequent striking against object, initial encounter: Secondary | ICD-10-CM | POA: Diagnosis present

## 2014-08-04 DIAGNOSIS — E785 Hyperlipidemia, unspecified: Secondary | ICD-10-CM | POA: Diagnosis present

## 2014-08-04 DIAGNOSIS — S92901A Unspecified fracture of right foot, initial encounter for closed fracture: Secondary | ICD-10-CM | POA: Diagnosis present

## 2014-08-04 DIAGNOSIS — S92902A Unspecified fracture of left foot, initial encounter for closed fracture: Secondary | ICD-10-CM | POA: Diagnosis present

## 2014-08-04 DIAGNOSIS — T50904A Poisoning by unspecified drugs, medicaments and biological substances, undetermined, initial encounter: Secondary | ICD-10-CM

## 2014-08-04 DIAGNOSIS — M545 Low back pain: Secondary | ICD-10-CM | POA: Diagnosis present

## 2014-08-04 DIAGNOSIS — S92322A Displaced fracture of second metatarsal bone, left foot, initial encounter for closed fracture: Secondary | ICD-10-CM | POA: Diagnosis present

## 2014-08-04 DIAGNOSIS — S92331A Displaced fracture of third metatarsal bone, right foot, initial encounter for closed fracture: Secondary | ICD-10-CM | POA: Diagnosis present

## 2014-08-04 DIAGNOSIS — I952 Hypotension due to drugs: Secondary | ICD-10-CM

## 2014-08-04 DIAGNOSIS — W19XXXA Unspecified fall, initial encounter: Secondary | ICD-10-CM

## 2014-08-04 DIAGNOSIS — T401X1A Poisoning by heroin, accidental (unintentional), initial encounter: Secondary | ICD-10-CM | POA: Diagnosis present

## 2014-08-04 LAB — CBG MONITORING, ED: GLUCOSE-CAPILLARY: 349 mg/dL — AB (ref 70–99)

## 2014-08-04 MED ORDER — SODIUM CHLORIDE 0.9 % IV SOLN
Freq: Once | INTRAVENOUS | Status: AC
  Administered 2014-08-04: 23:00:00 via INTRAVENOUS

## 2014-08-04 NOTE — ED Provider Notes (Signed)
CSN: [id number]     Arrival date & time 08/04/14  2306 History   First MD Initiated Contact with Patient 08/04/14 2320     No chief complaint on file.    (Consider location/radiation/quality/duration/timing/severity/associated sxs/prior Treatment) HPI  Allison [name] is a 58 y.o. female with past medical history of hypertension, hyperlipidemia, diabetes, heroin use presenting today with right foot pain. Patient states yesterday she tripped and fell over something. At that time she has significant pain in her right foot however it has gotten worse today. She is drowsy but arousable on history, she states because she took a total of 400 mg of Seroquel this evening. She does have a history of heroin use but denies using that today. She states she quit after her last episode in this emergency department because she was scared to die and leave her grandchildren alone. She is currently denying pain anywhere else. Patient has no further complaints.   10 Systems reviewed and are negative for acute change except as noted in the HPI.    Past Medical History  Diagnosis Date  . Hypertension   . Hyperlipidemia   . Chronic lumbar pain   . 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 Generalized osteoarthrosis, involving multiple sites   . Acute drug-induced gout of right ankle   . Acute gout due to renal impairment involving ankle   . 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 Abdominal hysterectomy  1979  . Oophorectomy  1979  . Breast surgery Bilateral 1986    gel implants    History reviewed. No pertinent family history. History  Substance Use Topics  . Smoking status: Current Every Day Smoker -- 1.50 packs/day    Types: Cigarettes  . Smokeless tobacco: Not on file  . Alcohol Use: No   OB History    No data available     Review of Systems    Allergies  Review of patient's allergies  indicates no known allergies.  Home Medications   Prior to Admission medications   Medication Sig Start Date End Date Taking? Authorizing Provider  ALPRAZolam Prudy Feeler) 0.5 MG tablet TAKE ONE TABLET BY MOUTH THREE TIMES DAILY AS NEEDED FOR ANXIETY 07/18/14   Quentin Mulling, PA-C  aspirin 81 MG chewable tablet Chew 81 mg by mouth daily.     Historical Provider, MD  busPIRone (BUSPAR) 10 MG tablet 10 mg daily.  05/11/14   Historical Provider, MD  carbamazepine (TEGRETOL) 200 MG tablet Take 200 mg by mouth daily.  08/02/13   Historical Provider, MD  Cholecalciferol (VITAMIN D PO) Take 8,000 Units by mouth daily.     Historical Provider, MD  clonazePAM (KLONOPIN) 0.5 MG tablet Take 0.5 mg by mouth 3 (three) times daily as needed for anxiety.    Historical Provider, MD  cyclobenzaprine (FLEXERIL) 10 MG tablet TAKE ONE TABLET BY MOUTH EVERY 8 HOURS AS NEEDED FOR MUSCLE SPASM 04/17/14   Quentin Mulling, PA-C  glipiZIDE (GLUCOTROL) 10 MG tablet Take 5 mg by mouth 3 (three) times daily before meals.    Historical Provider, MD  lisinopril-hydrochlorothiazide (PRINZIDE,ZESTORETIC) 20-12.5 MG per tablet TAKE ONE TABLET BY MOUTH IN THE MORNING FOR  BLOOD  PRESSURE 05/27/14   Lucky Cowboy, MD  LYRICA 100 MG capsule TAKE ONE CAPSULE BY MOUTH TWICE DAILY 07/18/14   Quentin Mulling, PA-C  meloxicam (MOBIC) 15 MG tablet TAKE ONE TABLET BY MOUTH  ONCE DAILY AS NEEDED FOR PAIN AND  FOR  INFLAMMATION 05/27/14   Lucky CowboyWilliam McKeown, MD  metFORMIN (GLUCOPHAGE-XR) 500 MG 24 hr tablet TAKE ONE TABLET BY MOUTH WITH BREAKFAST AND LUNCH AND TWO TABLETS BY MOUTH WITH SUPPER FOR DIABETES 03/14/14   Lucky CowboyWilliam McKeown, MD  pravastatin (PRAVACHOL) 40 MG tablet Take 40 mg by mouth daily.    Historical Provider, MD  QUEtiapine (SEROQUEL XR) 50 MG TB24 24 hr tablet Take 50 mg by mouth daily.    Historical Provider, MD   BP 86/50 mmHg  Pulse 95  Temp(Src) 98.7 F (37.1 C) (Oral)  Resp 18  SpO2 91% Physical Exam  Constitutional: She is  oriented to person, place, and time. She appears well-developed and well-nourished. No distress.  HENT:  Head: Normocephalic and atraumatic.  Nose: Nose normal.  Mouth/Throat: Oropharynx is clear and moist. No oropharyngeal exudate.  Eyes: Conjunctivae and EOM are normal. Pupils are equal, round, and reactive to light. No scleral icterus.  Neck: Normal range of motion. Neck supple. No JVD present. No tracheal deviation present. No thyromegaly present.  Cardiovascular: Normal rate, regular rhythm and normal heart sounds.  Exam reveals no gallop and no friction rub.   No murmur heard. Pulmonary/Chest: Effort normal and breath sounds normal. No respiratory distress. She has no wheezes. She exhibits no tenderness.  Abdominal: Soft. Bowel sounds are normal. She exhibits no distension and no mass. There is no tenderness. There is no rebound and no guarding.  Musculoskeletal: Normal range of motion. She exhibits edema and tenderness.  Bilateral feet are swollen and tender to palpation. Right is greater than left. There is a good dorsalis pedis and posterior tibial pulse and normal sensation distally.  Lymphadenopathy:    She has no cervical adenopathy.  Neurological: She is alert and oriented to person, place, and time. No cranial nerve deficit. She exhibits normal muscle tone.  Drowsy but arousable, easily follows commands. Normal strength and sensation 4 extremities.  Skin: Skin is warm and dry. No rash noted. She is not diaphoretic. No erythema. No pallor.  Nursing note and vitals reviewed.   ED Course  Procedures (including critical care time) Labs Review Labs Reviewed  CBC WITH DIFFERENTIAL - Abnormal; Notable for the following:    WBC 11.5 (*)    HCT 35.2 (*)    All other components within normal limits  BASIC METABOLIC PANEL - Abnormal; Notable for the following:    Sodium 133 (*)    Potassium 3.0 (*)    Glucose, Bld 297 (*)    BUN 34 (*)    Creatinine, Ser 3.21 (*)    Calcium 8.0  (*)    GFR calc non Af Amer 15 (*)    GFR calc Af Amer 17 (*)    All other components within normal limits  URINE RAPID DRUG SCREEN (HOSP PERFORMED) - Abnormal; Notable for the following:    Opiates POSITIVE (*)    Benzodiazepines POSITIVE (*)    All other components within normal limits  CBC - Abnormal; Notable for the following:    Hemoglobin 11.3 (*)    HCT 34.3 (*)    All other components within normal limits  BASIC METABOLIC PANEL - Abnormal; Notable for the following:    Potassium 2.8 (*)    Glucose, Bld 177 (*)    BUN 29 (*)    Creatinine, Ser 2.38 (*)    Calcium 7.5 (*)    GFR calc non Af Amer 21 (*)  GFR calc Af Amer 25 (*)    Anion gap 3 (*)    All other components within normal limits  URINALYSIS, ROUTINE W REFLEX MICROSCOPIC - Abnormal; Notable for the following:    Glucose, UA >1000 (*)    Hgb urine dipstick TRACE (*)    All other components within normal limits  URINE MICROSCOPIC-ADD ON - Abnormal; Notable for the following:    Bacteria, UA FEW (*)    All other components within normal limits  GLUCOSE, CAPILLARY - Abnormal; Notable for the following:    Glucose-Capillary 155 (*)    All other components within normal limits  GLUCOSE, CAPILLARY - Abnormal; Notable for the following:    Glucose-Capillary 212 (*)    All other components within normal limits  ACETAMINOPHEN LEVEL - Abnormal; Notable for the following:    Acetaminophen (Tylenol), Serum <10.0 (*)    All other components within normal limits  COMPREHENSIVE METABOLIC PANEL - Abnormal; Notable for the following:    Glucose, Bld 178 (*)    Creatinine, Ser 1.94 (*)    Calcium 8.3 (*)    Total Protein 5.8 (*)    Albumin 3.0 (*)    GFR calc non Af Amer 28 (*)    GFR calc Af Amer 32 (*)    All other components within normal limits  CBG MONITORING, ED - Abnormal; Notable for the following:    Glucose-Capillary 349 (*)    All other components within normal limits  MRSA PCR SCREENING  ETHANOL   PREGNANCY, URINE  MAGNESIUM  NA AND K (SODIUM & POTASSIUM), RAND UR  CREATININE, URINE, RANDOM  HEMOGLOBIN A1C    Imaging Review Dg Foot Complete Left  08/05/2014   CLINICAL DATA:  Fall with ankle injury and foot injury  EXAM: LEFT FOOT - COMPLETE 3+ VIEW  COMPARISON:  None.  FINDINGS: There is a fracture at the base of the second metatarsal which enters the articular surface. Irregularity of the base of the third metatarsal is incompletely imaged. No clear evidence dislocation.  IMPRESSION: 1. Intraarticular fracture at the base of the second metatarsal. 2. Cannot exclude fracture at the base of the third metatarsal. Consider CT of the foot for further evaluation.   Electronically Signed   By: Genevive BiStewart  Edmunds M.D.   On: 08/05/2014 00:04   Dg Foot Complete Right  08/05/2014   CLINICAL DATA:  Status post fall; injury to right ankle, with bilateral foot pain and swelling. Initial encounter.  EXAM: RIGHT FOOT COMPLETE - 3+ VIEW  COMPARISON:  None.  FINDINGS: There is a Lisfranc fracture/dislocation, with lateral displacement of the fractured second through fifth metatarsals. The distance between the bases of the first and second metatarsals measures approximately 5 mm. There is a mildly comminuted fracture at the base of the second metatarsal, and apparent fractures at the bases of the third, fourth and fifth metatarsals, with a small avulsion fragment also noted at the base of the fifth metatarsal.  No additional fractures are seen. A bipartite medial sesamoid of the first toe is noted. Soft tissue swelling is noted about the forefoot.  IMPRESSION: 1. Lisfranc fracture/dislocation, with lateral displacement of the second through fifth metatarsals. Mildly comminuted fracture at the base of the second metatarsal, and apparent fractures of the bases of the third, fourth and fifth metatarsals, with a small avulsion fragment also seen at the base of the fifth metatarsal. 2. Bipartite medial sesamoid of the  first toe.   Electronically Signed   By:  Roanna Raider M.D.   On: 08/05/2014 00:11     EKG Interpretation None      MDM   Final diagnoses:  Fall    Patient since emergency department after a fall and having severe right foot pain. Will evaluate with x-rays for fracture. She states she is drowsy due to her Seroquel use this evening, she is denying any illicit drugs. She states she has not used heroin since August.  He continues to be hypotensive, this can certainly be seen in Seroquel and opioid overdose. Pupils are not pinpoint however. Injection screen is pending, she also has acute on chronic renal failure with a creatinine of 3. If patient did in fact use heroin, her AKI could have caused a worsening effect of the usual drug dose.  Magnesium was advised to be checked by poison control which is normal. Patient will need to be admitted for continued management. Potassium was replaced int the ED.  She was admitted to [facility] hospitalist, telemetry.    Tomasita Crumble, MD 08/05/14 [phone]

## 2014-08-04 NOTE — ED Notes (Signed)
Patient transported to X-ray 

## 2014-08-04 NOTE — ED Notes (Signed)
MD at bedside. 

## 2014-08-04 NOTE — ED Notes (Signed)
Per PTAR , pt. From home lethargic and stated that she took 2 of 50mg  of seroquel  And 150mg  of her husband seroquel all together at around 10pm this evening, pt. Also stated that she fell today and hurt her left ankle. Also stated that  She fell the other day where she hurt her right ankle . Pt. Is oriented x2.  Received 5000ml Ns on board EMS for BP of 80/1642mmhg.

## 2014-08-04 NOTE — ED Notes (Signed)
Pt. Admitted to MD of taking a total dose of 400mg  of Seroquel tonight.

## 2014-08-05 ENCOUNTER — Encounter: Payer: Self-pay | Admitting: Physician Assistant

## 2014-08-05 ENCOUNTER — Inpatient Hospital Stay (HOSPITAL_COMMUNITY): Payer: Medicare HMO

## 2014-08-05 DIAGNOSIS — L03115 Cellulitis of right lower limb: Secondary | ICD-10-CM | POA: Diagnosis not present

## 2014-08-05 DIAGNOSIS — S92902A Unspecified fracture of left foot, initial encounter for closed fracture: Secondary | ICD-10-CM | POA: Diagnosis present

## 2014-08-05 DIAGNOSIS — N179 Acute kidney failure, unspecified: Secondary | ICD-10-CM | POA: Diagnosis present

## 2014-08-05 DIAGNOSIS — S92331A Displaced fracture of third metatarsal bone, right foot, initial encounter for closed fracture: Secondary | ICD-10-CM | POA: Diagnosis not present

## 2014-08-05 DIAGNOSIS — S93324A Dislocation of tarsometatarsal joint of right foot, initial encounter: Secondary | ICD-10-CM

## 2014-08-05 DIAGNOSIS — E876 Hypokalemia: Secondary | ICD-10-CM | POA: Diagnosis not present

## 2014-08-05 DIAGNOSIS — M545 Low back pain: Secondary | ICD-10-CM | POA: Diagnosis not present

## 2014-08-05 DIAGNOSIS — E785 Hyperlipidemia, unspecified: Secondary | ICD-10-CM | POA: Diagnosis not present

## 2014-08-05 DIAGNOSIS — S92322A Displaced fracture of second metatarsal bone, left foot, initial encounter for closed fracture: Secondary | ICD-10-CM | POA: Diagnosis not present

## 2014-08-05 DIAGNOSIS — I129 Hypertensive chronic kidney disease with stage 1 through stage 4 chronic kidney disease, or unspecified chronic kidney disease: Secondary | ICD-10-CM | POA: Diagnosis not present

## 2014-08-05 DIAGNOSIS — F3181 Bipolar II disorder: Secondary | ICD-10-CM | POA: Diagnosis not present

## 2014-08-05 DIAGNOSIS — I959 Hypotension, unspecified: Secondary | ICD-10-CM | POA: Diagnosis not present

## 2014-08-05 DIAGNOSIS — I9589 Other hypotension: Secondary | ICD-10-CM

## 2014-08-05 DIAGNOSIS — W010XXA Fall on same level from slipping, tripping and stumbling without subsequent striking against object, initial encounter: Secondary | ICD-10-CM | POA: Diagnosis not present

## 2014-08-05 DIAGNOSIS — Z79899 Other long term (current) drug therapy: Secondary | ICD-10-CM | POA: Diagnosis not present

## 2014-08-05 DIAGNOSIS — S92901A Unspecified fracture of right foot, initial encounter for closed fracture: Secondary | ICD-10-CM | POA: Diagnosis present

## 2014-08-05 DIAGNOSIS — T43591A Poisoning by other antipsychotics and neuroleptics, accidental (unintentional), initial encounter: Secondary | ICD-10-CM | POA: Diagnosis not present

## 2014-08-05 DIAGNOSIS — E119 Type 2 diabetes mellitus without complications: Secondary | ICD-10-CM

## 2014-08-05 DIAGNOSIS — T50901A Poisoning by unspecified drugs, medicaments and biological substances, accidental (unintentional), initial encounter: Secondary | ICD-10-CM | POA: Diagnosis present

## 2014-08-05 DIAGNOSIS — M79671 Pain in right foot: Secondary | ICD-10-CM | POA: Diagnosis present

## 2014-08-05 DIAGNOSIS — Z7982 Long term (current) use of aspirin: Secondary | ICD-10-CM | POA: Diagnosis not present

## 2014-08-05 DIAGNOSIS — N182 Chronic kidney disease, stage 2 (mild): Secondary | ICD-10-CM | POA: Diagnosis not present

## 2014-08-05 DIAGNOSIS — T401X1A Poisoning by heroin, accidental (unintentional), initial encounter: Secondary | ICD-10-CM | POA: Diagnosis not present

## 2014-08-05 DIAGNOSIS — S92321A Displaced fracture of second metatarsal bone, right foot, initial encounter for closed fracture: Secondary | ICD-10-CM | POA: Diagnosis not present

## 2014-08-05 DIAGNOSIS — S92341A Displaced fracture of fourth metatarsal bone, right foot, initial encounter for closed fracture: Secondary | ICD-10-CM | POA: Diagnosis not present

## 2014-08-05 DIAGNOSIS — G8929 Other chronic pain: Secondary | ICD-10-CM | POA: Diagnosis not present

## 2014-08-05 DIAGNOSIS — S92351A Displaced fracture of fifth metatarsal bone, right foot, initial encounter for closed fracture: Secondary | ICD-10-CM | POA: Diagnosis not present

## 2014-08-05 DIAGNOSIS — F1110 Opioid abuse, uncomplicated: Secondary | ICD-10-CM | POA: Diagnosis not present

## 2014-08-05 DIAGNOSIS — S93326A Dislocation of tarsometatarsal joint of unspecified foot, initial encounter: Secondary | ICD-10-CM | POA: Diagnosis present

## 2014-08-05 DIAGNOSIS — G9340 Encephalopathy, unspecified: Secondary | ICD-10-CM | POA: Diagnosis not present

## 2014-08-05 DIAGNOSIS — Z23 Encounter for immunization: Secondary | ICD-10-CM | POA: Diagnosis not present

## 2014-08-05 DIAGNOSIS — E1140 Type 2 diabetes mellitus with diabetic neuropathy, unspecified: Secondary | ICD-10-CM | POA: Diagnosis not present

## 2014-08-05 DIAGNOSIS — F17210 Nicotine dependence, cigarettes, uncomplicated: Secondary | ICD-10-CM | POA: Diagnosis not present

## 2014-08-05 LAB — URINALYSIS, ROUTINE W REFLEX MICROSCOPIC
BILIRUBIN URINE: NEGATIVE
Glucose, UA: >1000 mg/dL — AB
KETONES UR: NEGATIVE mg/dL
Leukocytes, UA: NEGATIVE
Nitrite: NEGATIVE
Protein, ur: NEGATIVE mg/dL
SPECIFIC GRAVITY, URINE: 1.012 (ref 1.005–1.030)
Urobilinogen, UA: 0.2 mg/dL (ref 0.0–1.0)
pH: 5 (ref 5.0–8.0)

## 2014-08-05 LAB — NA AND K (SODIUM & POTASSIUM), RAND UR
POTASSIUM UR: 24 meq/L
Sodium, Ur: 26 mEq/L

## 2014-08-05 LAB — BASIC METABOLIC PANEL
ANION GAP: 3 — AB (ref 5–15)
Anion gap: 7 (ref 5–15)
BUN: 29 mg/dL — ABNORMAL HIGH (ref 6–23)
BUN: 34 mg/dL — ABNORMAL HIGH (ref 6–23)
CHLORIDE: 110 meq/L (ref 96–112)
CO2: 26 mmol/L (ref 19–32)
CO2: 28 mmol/L (ref 19–32)
Calcium: 7.5 mg/dL — ABNORMAL LOW (ref 8.4–10.5)
Calcium: 8 mg/dL — ABNORMAL LOW (ref 8.4–10.5)
Chloride: 98 mEq/L (ref 96–112)
Creatinine, Ser: 2.38 mg/dL — ABNORMAL HIGH (ref 0.50–1.10)
Creatinine, Ser: 3.21 mg/dL — ABNORMAL HIGH (ref 0.50–1.10)
GFR calc Af Amer: 17 mL/min — ABNORMAL LOW (ref >90)
GFR calc Af Amer: 25 mL/min — ABNORMAL LOW (ref >90)
GFR calc non Af Amer: 21 mL/min — ABNORMAL LOW (ref >90)
GFR, EST NON AFRICAN AMERICAN: 15 mL/min — AB (ref >90)
GLUCOSE: 297 mg/dL — AB (ref 70–99)
Glucose, Bld: 177 mg/dL — ABNORMAL HIGH (ref 70–99)
POTASSIUM: 2.8 mmol/L — AB (ref 3.5–5.1)
POTASSIUM: 3 mmol/L — AB (ref 3.5–5.1)
SODIUM: 133 mmol/L — AB (ref 135–145)
SODIUM: 139 mmol/L (ref 135–145)

## 2014-08-05 LAB — CBC WITH DIFFERENTIAL/PLATELET
Basophils Absolute: 0 10*3/uL (ref 0.0–0.1)
Basophils Relative: 0 % (ref 0–1)
Eosinophils Absolute: 0.3 10*3/uL (ref 0.0–0.7)
Eosinophils Relative: 2 % (ref 0–5)
HCT: 35.2 % — ABNORMAL LOW (ref 36.0–46.0)
Hemoglobin: 12 g/dL (ref 12.0–15.0)
LYMPHS ABS: 2.6 10*3/uL (ref 0.7–4.0)
LYMPHS PCT: 23 % (ref 12–46)
MCH: 29.1 pg (ref 26.0–34.0)
MCHC: 34.1 g/dL (ref 30.0–36.0)
MCV: 85.2 fL (ref 78.0–100.0)
Monocytes Absolute: 0.9 10*3/uL (ref 0.1–1.0)
Monocytes Relative: 8 % (ref 3–12)
NEUTROS PCT: 67 % (ref 43–77)
Neutro Abs: 7.7 10*3/uL (ref 1.7–7.7)
Platelets: 228 10*3/uL (ref 150–400)
RBC: 4.13 MIL/uL (ref 3.87–5.11)
RDW: 13.9 % (ref 11.5–15.5)
WBC: 11.5 10*3/uL — AB (ref 4.0–10.5)

## 2014-08-05 LAB — CBC
HCT: 34.3 % — ABNORMAL LOW (ref 36.0–46.0)
Hemoglobin: 11.3 g/dL — ABNORMAL LOW (ref 12.0–15.0)
MCH: 28.3 pg (ref 26.0–34.0)
MCHC: 32.9 g/dL (ref 30.0–36.0)
MCV: 86 fL (ref 78.0–100.0)
PLATELETS: 204 10*3/uL (ref 150–400)
RBC: 3.99 MIL/uL (ref 3.87–5.11)
RDW: 14 % (ref 11.5–15.5)
WBC: 9.3 10*3/uL (ref 4.0–10.5)

## 2014-08-05 LAB — COMPREHENSIVE METABOLIC PANEL
ALT: 33 U/L (ref 0–35)
AST: 34 U/L (ref 0–37)
Albumin: 3 g/dL — ABNORMAL LOW (ref 3.5–5.2)
Alkaline Phosphatase: 78 U/L (ref 39–117)
Anion gap: 7 (ref 5–15)
BUN: 23 mg/dL (ref 6–23)
CALCIUM: 8.3 mg/dL — AB (ref 8.4–10.5)
CO2: 26 mmol/L (ref 19–32)
Chloride: 109 mEq/L (ref 96–112)
Creatinine, Ser: 1.94 mg/dL — ABNORMAL HIGH (ref 0.50–1.10)
GFR calc Af Amer: 32 mL/min — ABNORMAL LOW (ref >90)
GFR, EST NON AFRICAN AMERICAN: 28 mL/min — AB (ref >90)
Glucose, Bld: 178 mg/dL — ABNORMAL HIGH (ref 70–99)
Potassium: 3.5 mmol/L (ref 3.5–5.1)
Sodium: 142 mmol/L (ref 135–145)
TOTAL PROTEIN: 5.8 g/dL — AB (ref 6.0–8.3)
Total Bilirubin: 0.5 mg/dL (ref 0.3–1.2)

## 2014-08-05 LAB — GLUCOSE, CAPILLARY
GLUCOSE-CAPILLARY: 155 mg/dL — AB (ref 70–99)
GLUCOSE-CAPILLARY: 164 mg/dL — AB (ref 70–99)
Glucose-Capillary: 212 mg/dL — ABNORMAL HIGH (ref 70–99)
Glucose-Capillary: 185 mg/dL — ABNORMAL HIGH (ref 70–99)

## 2014-08-05 LAB — MRSA PCR SCREENING: MRSA BY PCR: NEGATIVE

## 2014-08-05 LAB — RAPID URINE DRUG SCREEN, HOSP PERFORMED
AMPHETAMINES: NOT DETECTED
Barbiturates: NOT DETECTED
Benzodiazepines: POSITIVE — AB
Cocaine: NOT DETECTED
OPIATES: POSITIVE — AB
TETRAHYDROCANNABINOL: NOT DETECTED

## 2014-08-05 LAB — CREATININE, URINE, RANDOM: Creatinine, Urine: 47.7 mg/dL

## 2014-08-05 LAB — MAGNESIUM: Magnesium: 2.1 mg/dL (ref 1.5–2.5)

## 2014-08-05 LAB — HEMOGLOBIN A1C
HEMOGLOBIN A1C: 10.2 % — AB (ref <5.7)
MEAN PLASMA GLUCOSE: 246 mg/dL — AB (ref <117)

## 2014-08-05 LAB — URINE MICROSCOPIC-ADD ON

## 2014-08-05 LAB — PREGNANCY, URINE: Preg Test, Ur: NEGATIVE

## 2014-08-05 LAB — ETHANOL: Alcohol, Ethyl (B): <5 mg/dL (ref 0–9)

## 2014-08-05 LAB — ACETAMINOPHEN LEVEL

## 2014-08-05 MED ORDER — NALOXONE HCL 0.4 MG/ML IJ SOLN
INTRAMUSCULAR | Status: AC
  Administered 2014-08-05: 0.4 mg
  Filled 2014-08-05: qty 1

## 2014-08-05 MED ORDER — NALOXONE HCL 1 MG/ML IJ SOLN
0.2500 mg/h | INTRAVENOUS | Status: DC
  Filled 2014-08-05: qty 4

## 2014-08-05 MED ORDER — TRAMADOL HCL 50 MG PO TABS
50.0000 mg | ORAL_TABLET | Freq: Once | ORAL | Status: AC
  Administered 2014-08-05: 50 mg via ORAL
  Filled 2014-08-05: qty 1

## 2014-08-05 MED ORDER — POTASSIUM CHLORIDE CRYS ER 20 MEQ PO TBCR
40.0000 meq | EXTENDED_RELEASE_TABLET | Freq: Once | ORAL | Status: AC
  Administered 2014-08-05: 40 meq via ORAL
  Filled 2014-08-05: qty 2

## 2014-08-05 MED ORDER — DOXYCYCLINE HYCLATE 100 MG PO TABS
100.0000 mg | ORAL_TABLET | Freq: Two times a day (BID) | ORAL | Status: DC
  Administered 2014-08-05 – 2014-08-06 (×3): 100 mg via ORAL
  Filled 2014-08-05 (×3): qty 1

## 2014-08-05 MED ORDER — CARBAMAZEPINE 200 MG PO TABS
200.0000 mg | ORAL_TABLET | Freq: Every day | ORAL | Status: DC
  Administered 2014-08-05 – 2014-08-07 (×3): 200 mg via ORAL
  Filled 2014-08-05 (×3): qty 1

## 2014-08-05 MED ORDER — BUSPIRONE HCL 10 MG PO TABS
10.0000 mg | ORAL_TABLET | Freq: Every day | ORAL | Status: DC
  Administered 2014-08-05 – 2014-08-07 (×3): 10 mg via ORAL
  Filled 2014-08-05 (×3): qty 1

## 2014-08-05 MED ORDER — ASPIRIN 81 MG PO CHEW
81.0000 mg | CHEWABLE_TABLET | Freq: Every day | ORAL | Status: DC
  Administered 2014-08-05 – 2014-08-07 (×3): 81 mg via ORAL
  Filled 2014-08-05 (×3): qty 1

## 2014-08-05 MED ORDER — NALOXONE HCL 0.4 MG/ML IJ SOLN: 0.2000 mg | INTRAMUSCULAR | Status: DC | PRN

## 2014-08-05 MED ORDER — QUETIAPINE FUMARATE ER 50 MG PO TB24: 50.0000 mg | ORAL_TABLET | Freq: Every day | ORAL | Status: DC

## 2014-08-05 MED ORDER — SODIUM CHLORIDE 0.9 % IV BOLUS (SEPSIS)
500.0000 mL | Freq: Once | INTRAVENOUS | Status: AC
  Administered 2014-08-05: 500 mL via INTRAVENOUS

## 2014-08-05 MED ORDER — SODIUM CHLORIDE 0.9 % IV BOLUS (SEPSIS)
1000.0000 mL | Freq: Once | INTRAVENOUS | Status: AC
  Administered 2014-08-05: 1000 mL via INTRAVENOUS

## 2014-08-05 MED ORDER — SODIUM CHLORIDE 0.9 % IV SOLN
INTRAVENOUS | Status: DC
  Administered 2014-08-05: 22:00:00 via INTRAVENOUS
  Administered 2014-08-05: 125 mL/h via INTRAVENOUS
  Administered 2014-08-07: 04:00:00 via INTRAVENOUS

## 2014-08-05 MED ORDER — CLONAZEPAM 0.5 MG PO TABS: 0.5000 mg | ORAL_TABLET | Freq: Three times a day (TID) | ORAL | Status: DC | PRN

## 2014-08-05 MED ORDER — PREGABALIN 50 MG PO CAPS
100.0000 mg | ORAL_CAPSULE | Freq: Two times a day (BID) | ORAL | Status: DC
  Administered 2014-08-05 – 2014-08-07 (×5): 100 mg via ORAL
  Filled 2014-08-05 (×3): qty 1
  Filled 2014-08-05: qty 2
  Filled 2014-08-05: qty 1
  Filled 2014-08-05: qty 2

## 2014-08-05 MED ORDER — SODIUM CHLORIDE 0.9 % IJ SOLN
3.0000 mL | Freq: Two times a day (BID) | INTRAMUSCULAR | Status: DC
  Administered 2014-08-05 – 2014-08-06 (×4): 3 mL via INTRAVENOUS

## 2014-08-05 MED ORDER — PRAVASTATIN SODIUM 40 MG PO TABS
40.0000 mg | ORAL_TABLET | Freq: Every day | ORAL | Status: DC
  Administered 2014-08-05 – 2014-08-07 (×3): 40 mg via ORAL
  Filled 2014-08-05 (×3): qty 1

## 2014-08-05 MED ORDER — POTASSIUM CHLORIDE CRYS ER 20 MEQ PO TBCR: 40.0000 meq | EXTENDED_RELEASE_TABLET | Freq: Once | ORAL | Status: DC

## 2014-08-05 MED ORDER — HEPARIN SODIUM (PORCINE) 5000 UNIT/ML IJ SOLN
5000.0000 [IU] | Freq: Three times a day (TID) | INTRAMUSCULAR | Status: DC
  Administered 2014-08-05 – 2014-08-07 (×7): 5000 [IU] via SUBCUTANEOUS
  Filled 2014-08-05 (×10): qty 1

## 2014-08-05 MED ORDER — INSULIN ASPART 100 UNIT/ML ~~LOC~~ SOLN
0.0000 [IU] | Freq: Three times a day (TID) | SUBCUTANEOUS | Status: DC
Start: 2014-08-05 — End: 2014-08-07
  Administered 2014-08-05: 5 [IU] via SUBCUTANEOUS
  Administered 2014-08-05 – 2014-08-06 (×3): 3 [IU] via SUBCUTANEOUS
  Administered 2014-08-06 – 2014-08-07 (×2): 2 [IU] via SUBCUTANEOUS

## 2014-08-05 NOTE — Progress Notes (Signed)
Patient ID: Allison [name] [name], female   DOB: 03/16/1956, 58 y.o.   MRN: [id number]  [facility] PROGRESS NOTE  Allison [name] [place] WUJ:[id number] DOB: 07/29/1956 DOA: 08/04/2014 PCP: Nadean CorwinMCKEOWN,WILLIAM DAVID, MD  Brief narrative: 58 y.o. female with h/o HTN, DM2, heroin use, presented to ED with R foot pain after an episode of fall one day prior to this admission. Foot is now more swollen and she explains she can not put any weight on her right foot. In ED, she was lethargic and was given one dose of Narcan IV after which lethargy resolved and she was admitted to SDU for further evaluation.    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 Sherlean FootLucey - recommends splint for now and weight bearing as tolerated - follow up with Dr. Lajoyce Cornersuda in an outpatient setting    Foot fracture, left - metatarsals - no interventions required per ortho team - analgesia as needed   Hypotension - continue to hold home antihypertensive meds   DVT prophylaxis  Heparin SQ while pt is in hospital  Code Status: Full Family Communication: Pt at bedside Disposition Plan: Keep in SDU until mental status improves   IV Access:   Peripheral IV Procedures and diagnostic studies:   Ct Head Wo Contrast  08/05/2014  Unremarkable noncontrast CT of the head.    Dg Foot Complete Left  08/05/2014  Intraarticular fracture at the base of the second metatarsal. 2. Cannot exclude fracture at the base of the third metatarsal.  Dg Foot Complete Right  08/05/2014  Lisfranc fracture/dislocation, with lateral displacement of the second through fifth metatarsals. Mildly comminuted fracture at the base of the second metatarsal, and apparent fractures of the bases of the third, fourth and fifth metatarsals, with a small avulsion fragment also seen at the base of the fifth metatarsal. 2. Bipartite medial sesamoid of the first toe.   Medical Consultants:   None Other Consultants:   None   Anti-Infectives:   Doxycycline 12/27 -->  Debbora PrestoMAGICK-Evie Croston, MD  [facility] Pager [phone]  If 7PM-7AM, please contact night-coverage [email or web address] Password [email or web address] 08/05/2014, 9:20 AM   LOS: 1 day   HPI/Subjective: No events overnight.   Objective: Filed Vitals:   08/05/14 0400 08/05/14 0500 08/05/14 0600 08/05/14 0800  BP: 86/53 93/56 93/51  91/59  Pulse: 95 89 94 101  Temp: 98.1 F (36.7  C)     TempSrc: Axillary     Resp: 13 15 14 15   Height:      Weight:      SpO2: 98% 98% 98% 98%    Intake/Output Summary (Last 24 hours) at 08/05/14 0920 Last data filed at 08/05/14 0700  Gross per 24 hour  Intake 2077.08 ml  Output   1000 ml  Net 1077.08 ml    Exam:   General:  Pt is alert, follows commands appropriately, not in acute distress  Cardiovascular: Regular rate and rhythm, S1/S2, no murmurs, no rubs, no gallops  Respiratory: Clear to auscultation bilaterally, no wheezing, no crackles,  no rhonchi  Abdomen: Soft, non tender, non distended, bowel sounds present, no guarding  Extremities: right foot swelling and TTP  Neuro: Grossly nonfocal  Data Reviewed: Basic Metabolic Panel:  Recent Labs Lab 07/31/14 1119 08/04/14 2347 08/05/14 0001 08/05/14 0528  NA 133* 133*  --  139  K 3.7 3.0*  --  2.8*  CL 93* 98  --  110  CO2 29 28  --  26  GLUCOSE 450* 297*  --  177*  BUN 22 34*  --  29*  CREATININE 2.00* 3.21*  --  2.38*  CALCIUM 9.5 8.0*  --  7.5*  MG 2.1  --  2.1  --    Liver Function Tests:  Recent Labs Lab 07/31/14 1119  AST 37  ALT 52*  ALKPHOS 96  BILITOT 0.5  PROT 6.5  ALBUMIN 3.8   CBC:  Recent Labs Lab 07/31/14 1119 08/04/14 2347 08/05/14 0528  WBC 8.1 11.5* 9.3  NEUTROABS 4.1 7.7  --   HGB 14.5 12.0 11.3*  HCT 43.9 35.2* 34.3*  MCV 87.5 85.2 86.0  PLT 272 228 204   CBG:  Recent Labs Lab 08/04/14 2322 08/05/14 0751  GLUCAP 349* 155*    Recent Results (from the past 240 hour(s))  MRSA PCR Screening     Status: None   Collection Time: 08/05/14  2:59 AM  Result Value Ref Range Status   MRSA by PCR NEGATIVE NEGATIVE Final     Scheduled Meds: . aspirin  81 mg Oral Daily  . busPIRone  10 mg Oral Daily  . carbamazepine  200 mg Oral Daily  . heparin  5,000 Units Subcutaneous 3 times per day  . insulin aspart  0-15 Units Subcutaneous TID WC  . pravastatin  40 mg Oral Daily  . pregabalin  100 mg Oral BID   Continuous Infusions: . sodium chloride 125 mL/hr (08/05/14 0223)

## 2014-08-05 NOTE — ED Notes (Signed)
Report called to the floor, receiving Nurse recommended head Ct scan be performed prior to transporting pt. To the floor, CT scan made aware.

## 2014-08-05 NOTE — H&P (Signed)
[facility] History and Physical  Allison GuildSheri Siedlecki AVW:[id number] DOB: 08/28/1955 DOA: 08/04/2014  Referring physician: EDP PCP: Nadean CorwinMCKEOWN,WILLIAM DAVID, MD   Chief Complaint: Overdose   HPI: Allison [name] is a 58 y.o. female with h/o HTN, DM2, heroin use who presents to ED with R foot pain.  Yesterday she tripped and fell over something.  Her pain in her R foot has gotten significantly worse today.  Today on presentation she is drowsy but arrousable.  She initially told EDP she took no heroin and only 400mg  of seroquel this evening to try to sleep; however, after becoming more lethargic in the ED, we did give her 0.4mg  total narcan IV after which her lethargy resolved, she woke up some and admitted to me that she was doing brown heroin.  Review of Systems: Systems reviewed.  As above, otherwise negative  Past Medical History  Diagnosis Date  . Hypertension   . Hyperlipidemia   . Chronic lumbar pain   . 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 Generalized osteoarthrosis, involving multiple sites   . Acute drug-induced gout of right ankle   . Acute gout due to renal impairment involving ankle   . 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 Abdominal hysterectomy  1979  . Oophorectomy  1979  . Breast surgery Bilateral 1986    gel implants    Social History:  reports that she has been smoking Cigarettes.  She has been smoking about 1.50 packs per day. She does not have any smokeless tobacco history on file. She reports that she does not drink alcohol. Her drug history is not on file.  No Known Allergies  History reviewed. No pertinent family history.   Prior to Admission medications   Medication Sig Start Date End Date Taking? Authorizing Provider  ALPRAZolam Prudy Feeler(XANAX) 0.5 MG tablet TAKE ONE TABLET BY MOUTH THREE TIMES DAILY AS NEEDED FOR ANXIETY 07/18/14  Yes Quentin MullingAmanda Collier, PA-C   aspirin 81 MG chewable tablet Chew 81 mg by mouth daily.    Yes Historical Provider, MD  busPIRone (BUSPAR) 10 MG tablet 10 mg daily.  05/11/14  Yes Historical Provider, MD  carbamazepine (TEGRETOL) 200 MG tablet Take 200 mg by mouth daily.  08/02/13  Yes Historical Provider, MD  Cholecalciferol (VITAMIN D PO) Take 8,000 Units by mouth daily.    Yes Historical Provider, MD  clonazePAM (KLONOPIN) 0.5 MG tablet Take 0.5 mg by mouth 3 (three) times daily as needed for anxiety.   Yes Historical Provider, MD  cyclobenzaprine (FLEXERIL) 10 MG tablet TAKE ONE TABLET BY MOUTH EVERY 8 HOURS AS NEEDED FOR MUSCLE SPASM 04/17/14  Yes Quentin MullingAmanda Collier, PA-C  glipiZIDE (GLUCOTROL) 10 MG tablet Take 5 mg by mouth 3 (three) times daily before meals.   Yes Historical Provider, MD  lisinopril-hydrochlorothiazide (PRINZIDE,ZESTORETIC) 20-12.5 MG per tablet TAKE ONE TABLET BY MOUTH IN THE MORNING FOR  BLOOD  PRESSURE 05/27/14  Yes Lucky CowboyWilliam McKeown, MD  LYRICA 100 MG capsule TAKE ONE CAPSULE BY MOUTH TWICE DAILY 07/18/14  Yes Quentin MullingAmanda Collier, PA-C  metFORMIN (GLUCOPHAGE-XR) 500 MG 24 hr tablet TAKE ONE TABLET BY MOUTH WITH BREAKFAST AND LUNCH AND TWO TABLETS BY MOUTH WITH SUPPER FOR DIABETES 03/14/14  Yes Lucky CowboyWilliam McKeown, MD  pravastatin (PRAVACHOL) 40 MG tablet Take 40 mg by mouth daily.   Yes Historical Provider, MD  QUEtiapine (SEROQUEL XR) 50 MG TB24 24 hr tablet Take  50 mg by mouth daily.   Yes Historical Provider, MD  meloxicam (MOBIC) 15 MG tablet TAKE ONE TABLET BY MOUTH ONCE DAILY AS NEEDED FOR PAIN AND  FOR  INFLAMMATION 05/27/14   Lucky Cowboy, MD   Physical Exam: Filed Vitals:   08/05/14 0130  BP: 81/59  Pulse: 96  Temp:   Resp: 23    BP 81/59 mmHg  Pulse 96  Temp(Src) 98.7 F (37.1 C) (Oral)  Resp 23  SpO2 95%  General Appearance:    Alert, oriented, no distress, appears stated age  Head:    Normocephalic, atraumatic  Eyes:    PERRL, EOMI, sclera non-icteric        Nose:   Nares without drainage  or epistaxis. Mucosa, turbinates normal  Throat:   Moist mucous membranes. Oropharynx without erythema or exudate.  Neck:   Supple. No carotid bruits.  No thyromegaly.  No lymphadenopathy.   Back:     No CVA tenderness, no spinal tenderness  Lungs:     Clear to auscultation bilaterally, without wheezes, rhonchi or rales  Chest wall:    No tenderness to palpitation  Heart:    Regular rate and rhythm without murmurs, gallops, rubs  Abdomen:     Soft, non-tender, nondistended, normal bowel sounds, no organomegaly  Genitalia:    deferred  Rectal:    deferred  Extremities:   No clubbing, cyanosis or edema.  Pulses:   2+ and symmetric all extremities  Skin:   Skin color, texture, turgor normal, no rashes or lesions  Lymph nodes:   Cervical, supraclavicular, and axillary nodes normal  Neurologic:   CNII-XII intact. Normal strength, sensation and reflexes      throughout    Labs on Admission:  Basic Metabolic Panel:  Recent Labs Lab 07/31/14 1119 08/04/14 2347 08/05/14 0001  NA 133* 133*  --   K 3.7 3.0*  --   CL 93* 98  --   CO2 29 28  --   GLUCOSE 450* 297*  --   BUN 22 34*  --   CREATININE 2.00* 3.21*  --   CALCIUM 9.5 8.0*  --   MG 2.1  --  2.1   Liver Function Tests:  Recent Labs Lab 07/31/14 1119  AST 37  ALT 52*  ALKPHOS 96  BILITOT 0.5  PROT 6.5  ALBUMIN 3.8   No results for input(s): LIPASE, AMYLASE in the last 168 hours. No results for input(s): AMMONIA in the last 168 hours. CBC:  Recent Labs Lab 07/31/14 1119 08/04/14 2347  WBC 8.1 11.5*  NEUTROABS 4.1 7.7  HGB 14.5 12.0  HCT 43.9 35.2*  MCV 87.5 85.2  PLT 272 228   Cardiac Enzymes: No results for input(s): CKTOTAL, CKMB, CKMBINDEX, TROPONINI in the last 168 hours.  BNP (last 3 results) No results for input(s): PROBNP in the last 8760 hours. CBG:  Recent Labs Lab 08/04/14 2322  GLUCAP 349*    Radiological Exams on Admission: Dg Foot Complete Left  08/05/2014   CLINICAL DATA:  Fall  with ankle injury and foot injury  EXAM: LEFT FOOT - COMPLETE 3+ VIEW  COMPARISON:  None.  FINDINGS: There is a fracture at the base of the second metatarsal which enters the articular surface. Irregularity of the base of the third metatarsal is incompletely imaged. No clear evidence dislocation.  IMPRESSION: 1. Intraarticular fracture at the base of the second metatarsal. 2. Cannot exclude fracture at the base of the third metatarsal. Consider CT of  the foot for further evaluation.   Electronically Signed   By: Genevive BiStewart  Edmunds M.D.   On: 08/05/2014 00:04   Dg Foot Complete Right  08/05/2014   CLINICAL DATA:  Status post fall; injury to right ankle, with bilateral foot pain and swelling. Initial encounter.  EXAM: RIGHT FOOT COMPLETE - 3+ VIEW  COMPARISON:  None.  FINDINGS: There is a Lisfranc fracture/dislocation, with lateral displacement of the fractured second through fifth metatarsals. The distance between the bases of the first and second metatarsals measures approximately 5 mm. There is a mildly comminuted fracture at the base of the second metatarsal, and apparent fractures at the bases of the third, fourth and fifth metatarsals, with a small avulsion fragment also noted at the base of the fifth metatarsal.  No additional fractures are seen. A bipartite medial sesamoid of the first toe is noted. Soft tissue swelling is noted about the forefoot.  IMPRESSION: 1. Lisfranc fracture/dislocation, with lateral displacement of the second through fifth metatarsals. Mildly comminuted fracture at the base of the second metatarsal, and apparent fractures of the bases of the third, fourth and fifth metatarsals, with a small avulsion fragment also seen at the base of the fifth metatarsal. 2. Bipartite medial sesamoid of the first toe.   Electronically Signed   By: Roanna RaiderJeffery  Chang M.D.   On: 08/05/2014 00:11    EKG: Independently reviewed.  Assessment/Plan Principal Problem:   Acute kidney failure Active  Problems:   T2_NIDDM   Hypotension   Overdose   Lisfranc dislocation   Foot fracture, right   Foot fracture, left   1. Overdose - likely unintentional overdose on heroin and seroquel, overdose is likely exacerbated by the fact that she is in acute kidney failure and so is not eliminating the heroin from her system.  Will put patient on narcan gtt at 0.25mg  per hour, continuous pulse ox and tele monitor in the SDU. 2. Acute kidney failure - unclear cause, possibly pre-renal as she was hypotensive on presentation 1. Goal BP map of 65, IVF at 150 cc/hr 2. Holding all BP meds for her hypotension 3. Holding nephrotoxic lisinopril, HCTZ, and NSAIDS 4. FeNa pending 5. UA pending 6. Monitor intake and output. 7. Likely warrants nephrology consultation in AM, but no emergent need for dialysis now. 3. B foot fractures including Lisfranc fracture dislocation of right foot - 1. Needs ortho consult in AM, Lisfranc fracture dislocations typically require surgery 2. Cam walker applied in ED 3. Patient is still overdosed on heroin so her pain is controlled at this time. 4. DM2 - hold metformin and other home meds and use med dose SSI with CBG checks AC/HS    Code Status: Full Code  Family Communication: No family in room Disposition Plan: Admit to inpatient   Time spent: 70 min  GARDNER, JARED M. [facility] Pager [phone]  If 7AM-7PM, please contact the day team taking care of the patient [email or web address] Password [email or web address] 08/05/2014, 1:42 AM

## 2014-08-05 NOTE — Progress Notes (Signed)
MD Izola PriceMyers aware patient's potassium 2.8

## 2014-08-05 NOTE — ED Notes (Signed)
Patient transported to CT 

## 2014-08-05 NOTE — ED Notes (Signed)
Called Poison Control, spoke to [employer] Financial CorporationCJ Murray,RN and recommended the following; check Serum magnesium and Serum K,  Monitor pt. For CNS depression , tachycardia, seizure and EKG irregularities x6 hours.

## 2014-08-05 NOTE — ED Notes (Signed)
MD at bedside. 

## 2014-08-06 LAB — CBC
HEMATOCRIT: 35.9 % — AB (ref 36.0–46.0)
HEMOGLOBIN: 12.1 g/dL (ref 12.0–15.0)
MCH: 28.5 pg (ref 26.0–34.0)
MCHC: 33.7 g/dL (ref 30.0–36.0)
MCV: 84.7 fL (ref 78.0–100.0)
Platelets: 231 10*3/uL (ref 150–400)
RBC: 4.24 MIL/uL (ref 3.87–5.11)
RDW: 13.6 % (ref 11.5–15.5)
WBC: 12.2 10*3/uL — ABNORMAL HIGH (ref 4.0–10.5)

## 2014-08-06 LAB — GLUCOSE, CAPILLARY
GLUCOSE-CAPILLARY: 116 mg/dL — AB (ref 70–99)
Glucose-Capillary: 144 mg/dL — ABNORMAL HIGH (ref 70–99)

## 2014-08-06 LAB — COMPREHENSIVE METABOLIC PANEL
ALT: 31 U/L (ref 0–35)
AST: 29 U/L (ref 0–37)
Albumin: 3.1 g/dL — ABNORMAL LOW (ref 3.5–5.2)
Alkaline Phosphatase: 86 U/L (ref 39–117)
Anion gap: 7 (ref 5–15)
BUN: 19 mg/dL (ref 6–23)
CALCIUM: 8.5 mg/dL (ref 8.4–10.5)
CO2: 23 mmol/L (ref 19–32)
Chloride: 107 mEq/L (ref 96–112)
Creatinine, Ser: 1.39 mg/dL — ABNORMAL HIGH (ref 0.50–1.10)
GFR calc Af Amer: 48 mL/min — ABNORMAL LOW (ref >90)
GFR calc non Af Amer: 41 mL/min — ABNORMAL LOW (ref >90)
Glucose, Bld: 154 mg/dL — ABNORMAL HIGH (ref 70–99)
Potassium: 3.3 mmol/L — ABNORMAL LOW (ref 3.5–5.1)
Sodium: 137 mmol/L (ref 135–145)
TOTAL PROTEIN: 6.3 g/dL (ref 6.0–8.3)
Total Bilirubin: 0.8 mg/dL (ref 0.3–1.2)

## 2014-08-06 MED ORDER — PNEUMOCOCCAL VAC POLYVALENT 25 MCG/0.5ML IJ INJ
0.5000 mL | INJECTION | INTRAMUSCULAR | Status: AC
  Administered 2014-08-07: 0.5 mL via INTRAMUSCULAR
  Filled 2014-08-06 (×2): qty 0.5

## 2014-08-06 MED ORDER — OXYCODONE-ACETAMINOPHEN 5-325 MG PO TABS
1.0000 | ORAL_TABLET | ORAL | Status: DC | PRN
  Administered 2014-08-06 – 2014-08-07 (×6): 2 via ORAL
  Filled 2014-08-06 (×6): qty 2

## 2014-08-06 MED ORDER — NICOTINE 21 MG/24HR TD PT24
21.0000 mg | MEDICATED_PATCH | Freq: Every day | TRANSDERMAL | Status: DC
  Administered 2014-08-06 – 2014-08-07 (×2): 21 mg via TRANSDERMAL
  Filled 2014-08-06 (×2): qty 1

## 2014-08-06 MED ORDER — CLINDAMYCIN PHOSPHATE 600 MG/50ML IV SOLN
600.0000 mg | Freq: Three times a day (TID) | INTRAVENOUS | Status: DC
  Administered 2014-08-06 – 2014-08-07 (×3): 600 mg via INTRAVENOUS
  Filled 2014-08-06 (×4): qty 50

## 2014-08-06 MED ORDER — INFLUENZA VAC SPLIT QUAD 0.5 ML IM SUSY
0.5000 mL | PREFILLED_SYRINGE | INTRAMUSCULAR | Status: AC
  Administered 2014-08-07: 0.5 mL via INTRAMUSCULAR
  Filled 2014-08-06 (×2): qty 0.5

## 2014-08-06 MED ORDER — POTASSIUM CHLORIDE 10 MEQ/100ML IV SOLN
10.0000 meq | INTRAVENOUS | Status: AC
  Administered 2014-08-06 (×3): 10 meq via INTRAVENOUS
  Filled 2014-08-06 (×3): qty 100

## 2014-08-06 MED ORDER — POTASSIUM CHLORIDE CRYS ER 20 MEQ PO TBCR
40.0000 meq | EXTENDED_RELEASE_TABLET | Freq: Once | ORAL | Status: AC
  Administered 2014-08-06: 40 meq via ORAL
  Filled 2014-08-06: qty 2

## 2014-08-06 NOTE — Progress Notes (Addendum)
Patient ID: Allison [name], female   DOB: 11/19/1955, 58 y.o.   MRN: [id number]  [facility] PROGRESS NOTE  Allison [name] [id number] DOB: 04/17/1956 DOA: 08/04/2014 PCP: Nadean CorwinMCKEOWN,WILLIAM DAVID, MD  Brief narrative: 58 y.o. female with h/o HTN, DM2, heroin use, presented to ED with R foot pain after an episode of fall one day prior to this admission. Foot is now more swollen and she explains she can not put any weight on her right foot. In ED, she was lethargic and was given one dose of Narcan IV after which lethargy resolved and she was admitted to SDU for further evaluation.   Assessment and Plan:    Principal Problem:  Acute encephalopathy - likely secondary to use of heroin and antidepressants at home - mental status is more clear this AM but still intermittent confusion - transfer to telemetry bed this afternoon if vitals remains stable  - psych consulted for further input and ? 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 Sherlean FootLucey - recommends splint for now and weight bearing as tolerated - follow up with Dr. Lajoyce Cornersuda in an outpatient setting  - started Clindamycin today  as swelling is worse   Foot fracture, left - metatarsals - no interventions required per ortho team - analgesia as needed   DVT prophylaxis  Heparin SQ while pt is in hospital  Code Status: Full Family Communication: Pt at bedside Disposition Plan: Transfer to telemetry bed   IV Access:    Peripheral IV Procedures and diagnostic studies:    Ct Head Wo Contrast 08/05/2014 Unremarkable noncontrast CT of the head.   Dg Foot Complete Left 08/05/2014 Intraarticular fracture at the base of the second metatarsal. 2. Cannot exclude fracture at the base of the third metatarsal.   Dg Foot Complete Right 08/05/2014 Lisfranc fracture/dislocation, with lateral displacement of the second through fifth metatarsals. Mildly comminuted fracture at the base of the second metatarsal, and apparent fractures of the bases of the third, fourth and fifth metatarsals, with a small avulsion fragment also seen at the base of the fifth metatarsal. 2. Bipartite medial sesamoid of the first toe.  Medical Consultants:    Psych  Other Consultants:    None  Anti-Infectives:    Doxycycline 12/27 --> 12/28  Clindamycin 12/28 -->   Debbora PrestoMAGICK-MYERS, ISKRA, MD  [facility] Pager [phone]  If 7PM-7AM, please contact night-coverage [email or web address] Password [email or web address] 08/06/2014, 12:36 PM   LOS: 2 days   HPI/Subjective: No events overnight.   Objective: Filed Vitals:   08/06/14 0800 08/06/14 1000 08/06/14 1125 08/06/14 1200  BP: 147/92  132/92   Pulse: 109 103 97  Temp: 98.9 F (37.2 C)   98.9 F (37.2 C)  TempSrc: Oral   Oral  Resp: 16 20 19    Height:      Weight:      SpO2: 98% 96% 98%     Intake/Output Summary (Last 24 hours) at 08/06/14 1236 Last data filed at 08/06/14 1126  Gross per 24 hour  Intake 3709.17 ml  Output   2900 ml  Net 809.17 ml    Exam:   General:  Pt is alert, follows commands appropriately, not in acute distress  Cardiovascular: Regular rate and rhythm,  S1/S2, no murmurs, no rubs, no gallops  Respiratory: Clear to auscultation bilaterally, no wheezing, no crackles, no rhonchi  Abdomen: Soft, non tender, non distended, bowel sounds present, no guarding  Extremities: right foot edema and TTP with erythema   Neuro: Grossly nonfocal  Data Reviewed: Basic Metabolic Panel:  Recent Labs Lab 07/31/14 1119 08/04/14 2347 08/05/14 0001 08/05/14 0528 08/05/14 1230 08/06/14 0352  NA 133* 133*  --  139 142 137  K 3.7 3.0*  --  2.8* 3.5 3.3*  CL 93* 98  --  110 109 107  CO2 29 28  --  26 26 23   GLUCOSE 450* 297*  --  177* 178* 154*  BUN 22 34*  --  29* 23 19  CREATININE 2.00* 3.21*  --  2.38* 1.94* 1.39*  CALCIUM 9.5 8.0*  --  7.5* 8.3* 8.5  MG 2.1  --  2.1  --   --   --    Liver Function Tests:  Recent Labs Lab 07/31/14 1119 08/05/14 1230 08/06/14 0352  AST 37 34 29  ALT 52* 33 31  ALKPHOS 96 78 86  BILITOT 0.5 0.5 0.8  PROT 6.5 5.8* 6.3  ALBUMIN 3.8 3.0* 3.1*   CBC:  Recent Labs Lab 07/31/14 1119 08/04/14 2347 08/05/14 0528 08/06/14 0352  WBC 8.1 11.5* 9.3 12.2*  NEUTROABS 4.1 7.7  --   --   HGB 14.5 12.0 11.3* 12.1  HCT 43.9 35.2* 34.3* 35.9*  MCV 87.5 85.2 86.0 84.7  PLT 272 228 204 231   CBG:  Recent Labs Lab 08/05/14 1143 08/05/14 1636 08/05/14 2152 08/06/14 0845 08/06/14 1200  GLUCAP 212* 164* 185* 144* 116*    Recent Results (from the past 240 hour(s))  MRSA PCR Screening     Status: None   Collection Time: 08/05/14  2:59 AM  Result Value Ref Range Status   MRSA by PCR NEGATIVE NEGATIVE Final    Comment:        The GeneXpert MRSA Assay (FDA approved for NASAL specimens only), is one component of a comprehensive MRSA colonization surveillance program. It is not intended to diagnose MRSA infection nor to guide or monitor treatment for MRSA infections.      Scheduled Meds: . aspirin  81 mg Oral Daily  . busPIRone  10 mg Oral Daily  . carbamazepine  200 mg Oral Daily  . doxycycline   100 mg Oral Q12H  . heparin  5,000 Units Subcutaneous 3 times per day  . insulin aspart  0-15 Units Subcutaneous TID WC  . pravastatin  40 mg Oral Daily  . pregabalin  100 mg Oral BID  . sodium chloride  3 mL Intravenous Q12H   Continuous Infusions: . sodium chloride 125 mL/hr at 08/05/14 2133

## 2014-08-06 NOTE — Progress Notes (Signed)
Inpatient Diabetes Program Recommendations  AACE/ADA: New Consensus Statement on Inpatient Glycemic Control (2013)  Target Ranges:  Prepandial:   less than 140 mg/dL      Peak postprandial:   less than 180 mg/dL (1-2 hours)      Critically ill patients:  140 - 180 mg/dL    Addendum:   Spoke with patient today at length regarding her current A1c of 10.2%.  Explained what an A1c is and what it measures.  Reminded patient that her goal A1c is 7% or less per ADA standards to prevent both acute and long-term complications.  Explained to patient the extreme importance of good glucose control at home.  Encouraged patient to check her CBGs at least bid at home (fasting and another check within the day) and to record all CBGs in a logbook for her PCP to review.     Patient told me she sees Dr. Lucky CowboyWilliam McKeown for medical care.  Per patient, she stopped taking her Glipizide about 6 months ago due to cost.  Question this reason, as Glipizide is generic and [facility] out of pocket with a Rx.  Patient went on to tell me that she drinks a lot of sweet tea at home and does not follow a diabetes friendly nutrition plan at home.   Discussed with patient the possibility of starting insulin at home.  Patient told me she was very hesitant to start insulin and would rather use oral medications and try to change her nutrition at home over starting insulin.     Also discussed DM diet information with patient.  Encouraged patient to avoid beverages with sugar (regular soda, sweet tea, lemonade, fruit juice) and to consume mostly water.  Discussed what foods contain carbohydrates and how carbohydrates affect the body's blood sugar levels.  Encouraged patient to be careful with her portion sizes (especially grains, starchy vegetables, and fruits).  Will consult RD for further DM diet education assistance with this patient.    MD- At time of discharge, patient will likely need the addition of  another oral DM medication to help better control her blood glucose levels.  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    We could try restarting patient's Glipizide that she stopped 6 months ago.  I have counseled patient on where to buy her DM medications for the cheapest price.  Patient could buy both her Metformin and Glipizide out of pocket for $4 per medication.    Will follow Ambrose FinlandJeannine Johnston Hali Balgobin RN, MSN, CDE Diabetes Coordinator Inpatient Diabetes Program Team Pager: [phone] (8a-10p)

## 2014-08-06 NOTE — Progress Notes (Signed)
Inpatient Diabetes Program Recommendations  AACE/ADA: New Consensus Statement on Inpatient Glycemic Control (2013)  Target Ranges:  Prepandial:   less than 140 mg/dL      Peak postprandial:   less than 180 mg/dL (1-2 hours)      Critically ill patients:  140 - 180 mg/dL     Results for Allison [name], [name] (MRN [id number]) as of 08/06/2014 08:58  Ref. Range 08/05/2014 07:51 08/05/2014 11:43 08/05/2014 16:36 08/05/2014 21:52  Glucose-Capillary Latest Range: 70-99 mg/dL 409155 (H) 811212 (H) 914164 (H) 185 (H)    Results for Allison [name], Allison [name] (MRN [id number]) as of 08/06/2014 08:58  Ref. Range 08/05/2014 05:25  Hgb A1c MFr Bld Latest Range: <5.7 % 10.2 (H)     "58 y.o. female with h/o HTN, DM2, heroin use, presented to ED with R foot pain after an episode of fall one day prior to this admission. Foot is now more swollen and she explains she can not put any weight on her right foot. In ED, she was lethargic and was given one dose of Narcan IV after which lethargy resolved and she was admitted to SDU for further evaluation"   Home DM Meds: Glipizide 5 mg tid       Metformin 500 mg breakfast/ 500 mg lunch/ 1000 mg dinner   Current Orders: Novolog Moderate SSI tid    **Note per record review that patient saw her PCP (Dr. Lucky CowboyWilliam McKeown) on 07/31/14.  A1c drawn at that visit was 10.9%.  No changes were made to patient's DM medication regimen by her PCP at that visit.  A1c drawn on 12/27 was 10.2%.  It may be very difficult for patient to manage her diabetes if she does not stop using illegal drugs at home.  **Fasting glucose levels are decent, however, patient is having some elevations in her postprandial glucose levels.    Will follow Ambrose FinlandJeannine Johnston Inda Mcglothen RN, MSN, CDE Diabetes Coordinator Inpatient Diabetes Program Team Pager: [phone] (8a-10p)

## 2014-08-07 DIAGNOSIS — T50901A Poisoning by unspecified drugs, medicaments and biological substances, accidental (unintentional), initial encounter: Secondary | ICD-10-CM | POA: Insufficient documentation

## 2014-08-07 DIAGNOSIS — F1110 Opioid abuse, uncomplicated: Secondary | ICD-10-CM

## 2014-08-07 DIAGNOSIS — F319 Bipolar disorder, unspecified: Secondary | ICD-10-CM

## 2014-08-07 DIAGNOSIS — N179 Acute kidney failure, unspecified: Secondary | ICD-10-CM | POA: Diagnosis not present

## 2014-08-07 LAB — BASIC METABOLIC PANEL
Anion gap: 5 (ref 5–15)
BUN: 18 mg/dL (ref 6–23)
CALCIUM: 8.3 mg/dL — AB (ref 8.4–10.5)
CO2: 25 mmol/L (ref 19–32)
CREATININE: 1.3 mg/dL — AB (ref 0.50–1.10)
Chloride: 106 mEq/L (ref 96–112)
GFR calc Af Amer: 52 mL/min — ABNORMAL LOW (ref >90)
GFR calc non Af Amer: 45 mL/min — ABNORMAL LOW (ref >90)
Glucose, Bld: 169 mg/dL — ABNORMAL HIGH (ref 70–99)
Potassium: 3.8 mmol/L (ref 3.5–5.1)
Sodium: 136 mmol/L (ref 135–145)

## 2014-08-07 LAB — GLUCOSE, CAPILLARY
Glucose-Capillary: 154 mg/dL — ABNORMAL HIGH (ref 70–99)
Glucose-Capillary: 168 mg/dL — ABNORMAL HIGH (ref 70–99)
Glucose-Capillary: 144 mg/dL — ABNORMAL HIGH (ref 70–99)
Glucose-Capillary: 231 mg/dL — ABNORMAL HIGH (ref 70–99)

## 2014-08-07 LAB — CBC
HCT: 34.3 % — ABNORMAL LOW (ref 36.0–46.0)
Hemoglobin: 11.2 g/dL — ABNORMAL LOW (ref 12.0–15.0)
MCH: 27.9 pg (ref 26.0–34.0)
MCHC: 32.7 g/dL (ref 30.0–36.0)
MCV: 85.3 fL (ref 78.0–100.0)
PLATELETS: 218 10*3/uL (ref 150–400)
RBC: 4.02 MIL/uL (ref 3.87–5.11)
RDW: 13.6 % (ref 11.5–15.5)
WBC: 8.7 10*3/uL (ref 4.0–10.5)

## 2014-08-07 LAB — HIV ANTIBODY (ROUTINE TESTING W REFLEX): HIV: NONREACTIVE

## 2014-08-07 MED ORDER — CLINDAMYCIN HCL 300 MG PO CAPS: 300.0000 mg | ORAL_CAPSULE | Freq: Three times a day (TID) | ORAL | Status: AC

## 2014-08-07 MED ORDER — HYDRALAZINE HCL 10 MG PO TABS: 10.0000 mg | ORAL_TABLET | Freq: Three times a day (TID) | ORAL | Status: AC

## 2014-08-07 MED ORDER — OXYCODONE-ACETAMINOPHEN 5-325 MG PO TABS: 1.0000 | ORAL_TABLET | ORAL | Status: AC | PRN

## 2014-08-07 MED ORDER — CLONAZEPAM 0.5 MG PO TABS: 0.5000 mg | ORAL_TABLET | Freq: Two times a day (BID) | ORAL | Status: AC | PRN

## 2014-08-07 NOTE — Plan of Care (Signed)
Problem: Food- and Nutrition-Related Knowledge Deficit (NB-1.1) Goal: Nutrition education Formal process to instruct or train a patient/client in a skill or to impart knowledge to help patients/clients voluntarily manage or modify food choices and eating behavior to maintain or improve health. Outcome: Completed/Met Date Met:  08/07/14  RD consulted for nutrition education regarding diabetes.     Lab Results  Component Value Date    HGBA1C 10.2* 08/05/2014    RD provided "Carbohydrate Counting for People with Diabetes" handout from the Academy of [employer] Dietetics. Discussed different food groups and their effects on blood sugar, emphasizing carbohydrate-containing foods. Provided list of carbohydrates and recommended serving sizes of common foods.  Discussed importance of controlled and consistent carbohydrate intake throughout the day. Provided pt with "MyPlate Meal Planning" handout for additional meal ideas and assistance with portion control.   Pt declined education as she reported she has received prior DM diet education and was familiar with appropriate diet recommendations. Pt noted that her mother also has DM, and that she assists her mother in managing dietary habits.   Expect fair compliance. Pt verbalized understanding in need to decrease intake of sugary beverages and sweet tea; discussed ways to decrease consumption and encouraged use of flavored waters, diet drinks or use of sugar substitutes.   Body mass index is 31.47 kg/(m^2). Pt meets criteria for Obesity I based on current BMI.  Current diet order is Carb Mod, patient is consuming approximately >75% of meals at this time. Labs and medications reviewed. No further nutrition interventions warranted at this time. RD contact information provided. If additional nutrition issues arise, please re-consult RD.  Atlee Abide MS RD LDN Clinical Dietitian [phone]

## 2014-08-07 NOTE — Discharge Instructions (Signed)
Lisfranc's Fracture-Dislocation and Mid-Foot Sprain with Rehab Lisfranc's fracture-dislocation is an injury to the mid-foot. The injury includes ligament tearing (sprains), although not usually complete, with or without break (fracture) of the bones of the mid-foot (metatarsal bones). These structures are important for establishing the arch of the foot.  SYMPTOMS   Sharp pain in the foot.  Pain that gets worse when standing or walking on the injured foot.  Tenderness, swelling, and bruising (contusion) at the injury site.  Numbness or paralysis from swelling in the foot, causing pressure on the blood vessels or nerves (uncommon). CAUSES  Lisfranc's fracture-dislocation injuries occur when a force is placed on the foot that is greater than the ligaments and bones can handle. Common causes of injury include:  Direct hit (trauma) to the mid-foot.  Twisting injury.  Landing on with the foot in an improper position.  Falling when another player is standing on your foot. RISK INCREASES WITH:  Participation in contact sports (football, rugby).  Sports that require jumping and landing (basketball, volleyball).  Sports in which cleats are worn on shoes and sliding occurs.  Previous foot or ankle sprains, dislocations, or repeated injury to any joint in the foot.  Poor strength and flexibility. PREVENTION  Warm up and stretch properly before activity.  Maintain physical fitness:  Strength, flexibility, and endurance.  Cardiovascular fitness.  When participating in jumping (basketball, volleyball) or contact sports, protect vulnerable joints with supportive devices (wrapped elastic bandages, tape, braces, high-top athletic shoes).  Use cleats or spikes of appropriate length for the sport and turf or field conditions.  Wear properly fitted and padded protective equipment. PROGNOSIS  If treated properly, Lisfranc's fracture-dislocations usually heal within 8 to 12 weeks. You may  experience residual pain and stiffness of the mid-foot after this injury.  RELATED COMPLICATIONS   Prolonged healing or recurring dislocation, if activity is resumed too soon.  Fracture fails to heal (nonunion).  Fracture heals in a poor position (malunion).  Chronic pain, stiffness, or swelling of the foot.  Excessive bleeding in the foot, causing pressure and injury to nerves and blood vessels (rare).  Unstable or arthritic joint, following repeated injury or delayed treatment.  Future surgery, to fuse the joints of the mid-foot, due to chronic pain or stiffness. TREATMENT  Treatment first involves the use of ice and medicine, to reduce pain and inflammation. If the fracture is displaced (bone fragments out of alignment), immediate realigning of the bone (reduction) by a person trained in the procedure is required. Fractures that cannot be realigned by hand, or are open (bones protrude through the skin), may require surgery to hold the fracture in place with screws, pins, and plates. Once the bones are in proper alignment, the foot must be restrained for 2 to 8 weeks. After restraint, it is important to perform strengthening and stretching exercises to regain strength and a full range of motion. These exercises may be completed at home or with a therapist. MEDICATION   If pain medicine is needed, nonsteroidal anti-inflammatory medicines (aspirin and ibuprofen), or other minor pain relievers (acetaminophen), are often advised.  Do not take pain medicine for 7 days before surgery.  Prescription pain relievers may be given, if your caregiver thinks they are needed. Use only as directed and only as much as you need. HEAT AND COLD  Cold treatment (icing) should be applied for 10 to 15 minutes every 2 to 3 hours for inflammation and pain, and immediately after activity that aggravates your symptoms. Use ice  packs or an ice massage.  Heat treatment may be used before performing stretching and  strengthening activities prescribed by your caregiver, physical therapist, or athletic trainer. Use a heat pack or a warm water soak. SEEK MEDICAL CARE IF:   Pain, tenderness, or swelling gets worse, despite treatment.  You experience pain, numbness, or coldness in the foot.  Blue, gray, or dark color appears in the toenails.  Any of the following occur after surgery: fever, increased pain, swelling, redness, drainage of fluids, or bleeding in the affected area.  New, unexplained symptoms develop. (Drugs used in treatment may produce side effects.) EXERCISES  RANGE OF MOTION (ROM) AND STRETCHING EXERCISES - Lisfranc's Fracture-Dislocation and Mid-Foot Sprain These exercises may help you when beginning to rehabilitate your injury. Your symptoms may resolve with or without further involvement from your physician, physical therapist or athletic trainer. While completing these exercises, remember:   Restoring tissue flexibility helps normal motion to return to the joints. This allows healthier, less painful movement and activity.  An effective stretch should be held for at least 30 seconds.  A stretch should never be painful. You should only feel a gentle lengthening or release in the stretched tissue. RANGE OF MOTION - Dorsi/Plantar Flexion  While sitting with your right / left knee straight, draw the top of your foot upwards by flexing your ankle. Then reverse the motion, pointing your toes downward.  Hold each position for __________ seconds.  After completing your first set of exercises, repeat this exercise with your knee bent. Repeat __________ times. Complete this exercise __________ times per day.  RANGE OF MOTION - Ankle Plantar Flexion   Sit with your right / left leg crossed over your opposite knee.  Use your opposite hand to pull the top of your foot and toes toward you.  You should feel a gentle stretch on the top of your foot and ankle. Hold this position for __________  seconds. Repeat __________ times. Complete __________ times per day.  RANGE OF MOTION - Ankle Inversion   Sit with your right / left ankle crossed over your opposite knee.  Grip your foot with your opposite hand, placing your thumb on the bottom of your foot and your fingers across the top of your foot.  Gently pull your foot so the smallest toe comes toward you and your thumb pushes the inside of the ball of your foot away from you.  You should feel a gentle stretch on the outside of your ankle. Hold the stretch for __________ seconds. Repeat __________ times. Complete this exercise __________ times per day.  RANGE OF MOTION - Ankle Alphabet  Imagine your right / left big toe is a pen.  Keeping your hip and knee still, write out the entire alphabet with your "pen." Make the letters as large as you can, without increasing any discomfort. Repeat __________ times. Complete this exercise __________ times per day.  RANGE OF MOTION - Ankle Dorsiflexion, Active Assisted   Remove your shoes and sit on a chair, preferably not on a carpeted surface.  Place your right / left foot directly under the knee. Extend your opposite leg for support.  Keeping your heel down, slide your right / left foot back toward the chair, until you feel a stretch at your ankle or calf. If you do not feel a stretch, slide your bottom forward to the edge of the chair while still keeping your heel down.  Hold this stretch for __________ seconds. Repeat __________ times. Complete  this stretch __________ times per day.  STRETCH - Gastroc, Standing   Place your hands on a wall.  Extend your right / left leg behind you, and place a folded washcloth under the arch of your foot for support. Keep the front knee somewhat bent.  Slightly point your toes inward on your back foot.  Keeping your right / left heel on the floor and your knee straight, shift your weight toward the wall, not allowing your back to arch.  You  should feel a gentle stretch in the right / left calf. Hold this position for __________ seconds. Repeat __________ times. Complete this stretch __________ times per day. STRETCH - Soleus, Standing   Place your hands on a wall.  Extend your right / left leg behind you, and place a folded washcloth under the arch of your foot for support. Keep the front knee somewhat bent.  Slightly point your toes inward on your back foot.  Keep your right / leftheel on the floor, bend your back knee, and slightly shift your weight over the back leg, so that you feel a gentle stretch deep in your back calf.  Hold this position for __________ seconds. Repeat __________ times. Complete this stretch __________ times per day. STRENGTHENING EXERCISES - Lisfranc's Fracture-Dislocation and Mid-Foot Sprain These exercises may help you when beginning to rehabilitate your injury. They may resolve your symptoms with or without further involvement from your physician, physical therapist or athletic trainer. While completing these exercises, remember:   Muscles can gain both the endurance and the strength needed for everyday activities through controlled exercises.  Complete these exercises as instructed by your physician, physical therapist or athletic trainer. Increase the resistance and repetitions only as guided by your caregiver. STRENGTH - Dorsiflexors  Secure a rubber exercise band or tubing to a fixed object (table, pole) and loop the other end around your right / left foot.  Sit on the floor facing the fixed object. The band should be slightly tense when your foot is relaxed.  Slowly draw your foot back toward you, using your ankle and toes.  Hold this position for __________ seconds. Slowly release the tension in the band and return your foot to the starting position. Repeat __________ times. Complete this exercise __________ times per day.  STRENGTH - Plantar-flexors   Sit with your right / left leg  extended. Holding onto both ends of a rubber exercise band or tubing, loop it around the ball of your foot. Keep a slight tension in the band.  Slowly push your toes away from you, pointing them downward.  Hold this position for __________ seconds. Return slowly, controlling the tension in the band/tubing. Repeat __________ times. Complete this exercise __________ times per day.  STRENGTH - Plantar-flexors, Standing   Stand with your feet shoulder width apart. Steady yourself with a wall or table, using as little support as needed.  Keeping your weight evenly spread over the width of your feet, rise up on your toes.*  Hold this position for __________ seconds. Repeat __________ times. Complete this exercise __________ times per day.  *If this is too easy, shift your weight toward your right / left leg until you feel challenged. Ultimately, you may be asked to do this exercise while standing on your right / left foot only. STRENGTH - Towel Curls  Sit in a chair, on a non-carpeted surface.  Place your foot on a towel, keeping your heel on the floor.  Pull the towel toward your heel  only by curling your toes. Keep your heel on the floor.  If instructed by your physician, physical therapist or athletic trainer, you may add weight at the end of the towel. Repeat __________ times. Complete this exercise __________ times per day. STRENGTH - Ankle Eversion   Secure one end of a rubber exercise band or tubing to a fixed object (table, pole). Loop the other end around your foot, just before your toes.  Place your fists between your knees. This will focus your strengthening at your ankle.  Drawing the band across your opposite foot, away from the pole, slowly, pull your little toe out and up. Make sure the band is positioned to resist the entire motion.  Hold this position for __________ seconds.  Have your muscles resist the band, as it slowly pulls your foot back to the starting  position. Repeat __________ times. Complete this exercise __________ times per day.  STRENGTH - Ankle Inversion   Secure one end of a rubber exercise band or tubing to a fixed object (table, pole). Loop the other end around your foot, just before your toes.  Place your fists between your knees. This will focus your strengthening at your ankle.  Slowly, pull your big toe up and in, making sure the band is positioned to resist the entire motion.  Hold this position for __________ seconds.  Have your muscles resist the band, as it slowly pulls your foot back to the starting position. Repeat __________ times. Complete this exercises __________ times per day.  Document Released: 07/27/2005 Document Revised: 10/19/2011 Document Reviewed: 11/08/2008 [facility] Patient Information 2015 [place], [place]. This information is not intended to replace advice given to you by your health care provider. Make sure you discuss any questions you have with your health care provider.

## 2014-08-07 NOTE — Consult Note (Signed)
[facility] Consult   Reason for Consult:  Heroin abuse Referring Physician:  EDP  Allison [name] is an 58 y.o. female. Total Time spent with patient: 45 minutes  Assessment: AXIS I:  Heroin abuse; bipolar affective disorder AXIS II:  Deferred AXIS III:   Past Medical History  Diagnosis Date  . Hypertension   . Hyperlipidemia   . Chronic lumbar pain   . 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 Generalized osteoarthrosis, involving multiple sites   . Acute drug-induced gout of right ankle   . Acute gout due to renal impairment involving ankle   . 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 Substance use disorder     heroin   AXIS IV:  chronic back pain AXIS V:  61-70 mild symptoms  Plan:  No evidence of imminent risk to self or others at present.  Recommend changing Klonopin 0.5 mg TID PRN anxiety to BID, follow-up with her regular providers at [facility] for psychiatric care after discharge.  Dr. Sabra Heck reviewed the patient and concurs with the plan.  Subjective:   Allison [name] is a 58 y.o. female patient does not warrant admission.  HPI:  The patient was using heroin to treat her chronic back pain prior to admission along with her psychiatric medications (Seroquel specifically).  She feel and fractured both of her feet (not requiring surgery or immobilization).  She denies any intentional overdose or suicide attempt.  Endorses situational depression at this time for injuries, low to moderate level.  She sees [facility] for her psychiatric care for her bipolar affective disorder; takes Seroquel 100 mg in am and 600 mg in the pm, buspar 10 mg TID, and Klonopin.  Denies alcohol use and reports this was her 3rd time using heroin and does not plan on using any more.  She lives with her 65 yo son who has Schizophrenia, well stabilized on medications which he takes.  Her husband lives next door and is a good support system for her and her  son.  Allison [name] has had significant family losses in the past and has attended grief counseling.  She does not desire any additional therapy at this time.  Denies suicidal/homicidal ideations, hallucinations, and alcohol abuse. HPI Elements:   Location:  generalized. Quality:  acute. Severity:  mild. Timing:  intermittent. Duration:  brief. Context:  back pain.  Past Psychiatric History: Past Medical History  Diagnosis Date  . Hypertension   . Hyperlipidemia   . Chronic lumbar pain   . 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 Generalized osteoarthrosis, involving multiple sites   . Acute drug-induced gout of right ankle   . Acute gout due to renal impairment involving ankle   . 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 Substance use disorder     heroin    reports that she has been smoking Cigarettes.  She has been smoking about 1.50 packs per day. She does not have any smokeless tobacco history on file. She reports that she does not drink alcohol. Her drug history is not on file. History reviewed. No pertinent family history.   Living Arrangements: Children (son)   Abuse/Neglect [facility]) Physical Abuse: Denies Verbal Abuse: Denies Sexual Abuse: Denies Allergies:  No Known Allergies  ACT Assessment Complete:  No:   Past Psychiatric History: Diagnosis:  Bipolar affective disorder  Hospitalizations:  [facility]  Outpatient Care:  [facility]  Substance Abuse Care:  None  Self-Mutilation:  None   Suicidal Attempts:  Denies  Homicidal Behaviors:  Denies   Violent Behaviors:  Denies   Place of Residence:  [place], [place] Marital Status:  Married  Employed/Unemployed:  Disabled due to back issues Education:  High school Family Supports:  Husband, son, niece, family Objective: Blood pressure 124/90, pulse 87, temperature 98.2 F (36.8 C), temperature source Oral, resp. rate 18, height 5' 6"  (1.676 m), weight 194 lb 14.2 oz (88.4 kg),  SpO2 100 %.Body mass index is 31.47 kg/(m^2). Results for orders placed or performed during the hospital encounter of 08/04/14 (from the past 72 hour(s))  CBG monitoring, ED     Status: Abnormal   Collection Time: 08/04/14 11:22 PM  Result Value Ref Range   Glucose-Capillary 349 (H) 70 - 99 mg/dL  CBC with Differential     Status: Abnormal   Collection Time: 08/04/14 11:47 PM  Result Value Ref Range   WBC 11.5 (H) 4.0 - 10.5 K/uL   RBC 4.13 3.87 - 5.11 MIL/uL   Hemoglobin 12.0 12.0 - 15.0 g/dL   HCT 35.2 (L) 36.0 - 46.0 %   MCV 85.2 78.0 - 100.0 fL   MCH 29.1 26.0 - 34.0 pg   MCHC 34.1 30.0 - 36.0 g/dL   RDW 13.9 11.5 - 15.5 %   Platelets 228 150 - 400 K/uL   Neutrophils Relative % 67 43 - 77 %   Neutro Abs 7.7 1.7 - 7.7 K/uL   Lymphocytes Relative 23 12 - 46 %   Lymphs Abs 2.6 0.7 - 4.0 K/uL   Monocytes Relative 8 3 - 12 %   Monocytes Absolute 0.9 0.1 - 1.0 K/uL   Eosinophils Relative 2 0 - 5 %   Eosinophils Absolute 0.3 0.0 - 0.7 K/uL   Basophils Relative 0 0 - 1 %   Basophils Absolute 0.0 0.0 - 0.1 K/uL  Basic metabolic panel     Status: Abnormal   Collection Time: 08/04/14 11:47 PM  Result Value Ref Range   Sodium 133 (L) 135 - 145 mmol/L    Comment: Please note change in reference range. DELTA CHECK NOTED    Potassium 3.0 (L) 3.5 - 5.1 mmol/L    Comment: Please note change in reference range. DELTA CHECK NOTED    Chloride 98 96 - 112 mEq/L   CO2 28 19 - 32 mmol/L   Glucose, Bld 297 (H) 70 - 99 mg/dL   BUN 34 (H) 6 - 23 mg/dL   Creatinine, Ser 3.21 (H) 0.50 - 1.10 mg/dL   Calcium 8.0 (L) 8.4 - 10.5 mg/dL   GFR calc non Af Amer 15 (L) >90 mL/min   GFR calc Af Amer 17 (L) >90 mL/min    Comment: (NOTE) The eGFR has been calculated using the CKD EPI equation. This calculation has not been validated in all clinical situations. eGFR's persistently <90 mL/min signify possible Chronic Kidney Disease.    Anion gap 7 5 - 15  Ethanol     Status: None   Collection Time:  08/04/14 11:47 PM  Result Value Ref Range   Alcohol, Ethyl (B) <5 0 - 9 mg/dL    Comment:        LOWEST DETECTABLE LIMIT FOR SERUM ALCOHOL IS 11 mg/dL FOR MEDICAL PURPOSES ONLY   Magnesium     Status: None   Collection Time: 08/05/14 12:01 AM  Result Value Ref Range   Magnesium 2.1 1.5 -  2.5 mg/dL  Urine rapid drug screen (hosp performed)     Status: Abnormal   Collection Time: 08/05/14  2:09 AM  Result Value Ref Range   Opiates POSITIVE (A) NONE DETECTED   Cocaine NONE DETECTED NONE DETECTED   Benzodiazepines POSITIVE (A) NONE DETECTED   Amphetamines NONE DETECTED NONE DETECTED   Tetrahydrocannabinol NONE DETECTED NONE DETECTED   Barbiturates NONE DETECTED NONE DETECTED    Comment:        DRUG SCREEN FOR MEDICAL PURPOSES ONLY.  IF CONFIRMATION IS NEEDED FOR ANY PURPOSE, NOTIFY LAB WITHIN 5 DAYS.        LOWEST DETECTABLE LIMITS FOR URINE DRUG SCREEN Drug Class       Cutoff (ng/mL) Amphetamine      1000 Barbiturate      200 Benzodiazepine   270 Tricyclics       786 Opiates          300 Cocaine          300 THC              50   Pregnancy, urine     Status: None   Collection Time: 08/05/14  2:09 AM  Result Value Ref Range   Preg Test, Ur NEGATIVE NEGATIVE    Comment:        THE SENSITIVITY OF THIS METHODOLOGY IS >20 mIU/mL.   Urinalysis, Routine w reflex microscopic     Status: Abnormal   Collection Time: 08/05/14  2:09 AM  Result Value Ref Range   Color, Urine YELLOW YELLOW   APPearance CLEAR CLEAR   Specific Gravity, Urine 1.012 1.005 - 1.030   pH 5.0 5.0 - 8.0   Glucose, UA >1000 (A) NEGATIVE mg/dL   Hgb urine dipstick TRACE (A) NEGATIVE   Bilirubin Urine NEGATIVE NEGATIVE   Ketones, ur NEGATIVE NEGATIVE mg/dL   Protein, ur NEGATIVE NEGATIVE mg/dL   Urobilinogen, UA 0.2 0.0 - 1.0 mg/dL   Nitrite NEGATIVE NEGATIVE   Leukocytes, UA NEGATIVE NEGATIVE  Na and K (sodium & potassium), rand urine     Status: None   Collection Time: 08/05/14  2:09 AM  Result  Value Ref Range   Sodium, Ur 26 mEq/L   Potassium Urine Timed 24 mEq/L    Comment: Performed at [facility]  Creatinine, urine, random     Status: None   Collection Time: 08/05/14  2:09 AM  Result Value Ref Range   Creatinine, Urine 47.7 mg/dL    Comment: No reference range established. Performed at [facility]   Urine microscopic-add on     Status: Abnormal   Collection Time: 08/05/14  2:09 AM  Result Value Ref Range   WBC, UA 0-2 <3 WBC/hpf   Bacteria, UA FEW (A) RARE  MRSA PCR Screening     Status: None   Collection Time: 08/05/14  2:59 AM  Result Value Ref Range   MRSA by PCR NEGATIVE NEGATIVE    Comment:        The GeneXpert MRSA Assay (FDA approved for NASAL specimens only), is one component of a comprehensive MRSA colonization surveillance program. It is not intended to diagnose MRSA infection nor to guide or monitor treatment for MRSA infections.   Hemoglobin A1c     Status: Abnormal   Collection Time: 08/05/14  5:25 AM  Result Value Ref Range   Hgb A1c MFr Bld 10.2 (H) <5.7 %    Comment: (NOTE)  According to the ADA Clinical Practice Recommendations for 2011, when HbA1c is used as a screening test:  >=6.5%   Diagnostic of Diabetes Mellitus           (if abnormal result is confirmed) 5.7-6.4%   Increased risk of developing Diabetes Mellitus References:Diagnosis and Classification of Diabetes Mellitus,Diabetes TGYB,6389,37(DSKAJ 1):S62-S69 and Standards of Medical Care in         Diabetes - 2011,Diabetes GOTL,5726,20 (Suppl 1):S11-S61.    Mean Plasma Glucose 246 (H) <117 mg/dL    Comment: Performed at [facility]  CBC     Status: Abnormal   Collection Time: 08/05/14  5:28 AM  Result Value Ref Range   WBC 9.3 4.0 - 10.5 K/uL   RBC 3.99 3.87 - 5.11 MIL/uL   Hemoglobin 11.3 (L) 12.0 - 15.0 g/dL   HCT 34.3 (L) 36.0 - 46.0 %   MCV 86.0 78.0 - 100.0 fL   MCH 28.3  26.0 - 34.0 pg   MCHC 32.9 30.0 - 36.0 g/dL   RDW 14.0 11.5 - 15.5 %   Platelets 204 150 - 400 K/uL  Basic metabolic panel     Status: Abnormal   Collection Time: 08/05/14  5:28 AM  Result Value Ref Range   Sodium 139 135 - 145 mmol/L    Comment: Please note change in reference range.   Potassium 2.8 (L) 3.5 - 5.1 mmol/L    Comment: Please note change in reference range.   Chloride 110 96 - 112 mEq/L    Comment: DELTA CHECK NOTED   CO2 26 19 - 32 mmol/L   Glucose, Bld 177 (H) 70 - 99 mg/dL   BUN 29 (H) 6 - 23 mg/dL   Creatinine, Ser 2.38 (H) 0.50 - 1.10 mg/dL   Calcium 7.5 (L) 8.4 - 10.5 mg/dL   GFR calc non Af Amer 21 (L) >90 mL/min   GFR calc Af Amer 25 (L) >90 mL/min    Comment: (NOTE) The eGFR has been calculated using the CKD EPI equation. This calculation has not been validated in all clinical situations. eGFR's persistently <90 mL/min signify possible Chronic Kidney Disease.    Anion gap 3 (L) 5 - 15  Glucose, capillary     Status: Abnormal   Collection Time: 08/05/14  7:51 AM  Result Value Ref Range   Glucose-Capillary 155 (H) 70 - 99 mg/dL  Glucose, capillary     Status: Abnormal   Collection Time: 08/05/14 11:43 AM  Result Value Ref Range   Glucose-Capillary 212 (H) 70 - 99 mg/dL  Acetaminophen level     Status: Abnormal   Collection Time: 08/05/14 12:30 PM  Result Value Ref Range   Acetaminophen (Tylenol), Serum <10.0 (L) 10 - 30 ug/mL    Comment:        THERAPEUTIC CONCENTRATIONS VARY SIGNIFICANTLY. A RANGE OF 10-30 ug/mL MAY BE AN EFFECTIVE CONCENTRATION FOR MANY PATIENTS. HOWEVER, SOME ARE BEST TREATED AT CONCENTRATIONS OUTSIDE THIS RANGE. ACETAMINOPHEN CONCENTRATIONS >150 ug/mL AT 4 HOURS AFTER INGESTION AND >50 ug/mL AT 12 HOURS AFTER INGESTION ARE OFTEN ASSOCIATED WITH TOXIC REACTIONS.   Comprehensive metabolic panel     Status: Abnormal   Collection Time: 08/05/14 12:30 PM  Result Value Ref Range   Sodium 142 135 - 145 mmol/L    Comment:  Please note change in reference range.   Potassium 3.5 3.5 - 5.1 mmol/L    Comment: Please note change in reference range. DELTA CHECK NOTED    Chloride 109  96 - 112 mEq/L   CO2 26 19 - 32 mmol/L   Glucose, Bld 178 (H) 70 - 99 mg/dL   BUN 23 6 - 23 mg/dL   Creatinine, Ser 1.94 (H) 0.50 - 1.10 mg/dL   Calcium 8.3 (L) 8.4 - 10.5 mg/dL   Total Protein 5.8 (L) 6.0 - 8.3 g/dL   Albumin 3.0 (L) 3.5 - 5.2 g/dL   AST 34 0 - 37 U/L   ALT 33 0 - 35 U/L   Alkaline Phosphatase 78 39 - 117 U/L   Total Bilirubin 0.5 0.3 - 1.2 mg/dL   GFR calc non Af Amer 28 (L) >90 mL/min   GFR calc Af Amer 32 (L) >90 mL/min    Comment: (NOTE) The eGFR has been calculated using the CKD EPI equation. This calculation has not been validated in all clinical situations. eGFR's persistently <90 mL/min signify possible Chronic Kidney Disease.    Anion gap 7 5 - 15  Glucose, capillary     Status: Abnormal   Collection Time: 08/05/14  4:36 PM  Result Value Ref Range   Glucose-Capillary 164 (H) 70 - 99 mg/dL  Glucose, capillary     Status: Abnormal   Collection Time: 08/05/14  9:52 PM  Result Value Ref Range   Glucose-Capillary 185 (H) 70 - 99 mg/dL  Comprehensive metabolic panel     Status: Abnormal   Collection Time: 08/06/14  3:52 AM  Result Value Ref Range   Sodium 137 135 - 145 mmol/L    Comment: Please note change in reference range.   Potassium 3.3 (L) 3.5 - 5.1 mmol/L    Comment: Please note change in reference range.   Chloride 107 96 - 112 mEq/L   CO2 23 19 - 32 mmol/L   Glucose, Bld 154 (H) 70 - 99 mg/dL   BUN 19 6 - 23 mg/dL   Creatinine, Ser 1.39 (H) 0.50 - 1.10 mg/dL   Calcium 8.5 8.4 - 10.5 mg/dL   Total Protein 6.3 6.0 - 8.3 g/dL   Albumin 3.1 (L) 3.5 - 5.2 g/dL   AST 29 0 - 37 U/L   ALT 31 0 - 35 U/L   Alkaline Phosphatase 86 39 - 117 U/L   Total Bilirubin 0.8 0.3 - 1.2 mg/dL   GFR calc non Af Amer 41 (L) >90 mL/min   GFR calc Af Amer 48 (L) >90 mL/min    Comment: (NOTE) The eGFR has  been calculated using the CKD EPI equation. This calculation has not been validated in all clinical situations. eGFR's persistently <90 mL/min signify possible Chronic Kidney Disease.    Anion gap 7 5 - 15  CBC     Status: Abnormal   Collection Time: 08/06/14  3:52 AM  Result Value Ref Range   WBC 12.2 (H) 4.0 - 10.5 K/uL   RBC 4.24 3.87 - 5.11 MIL/uL   Hemoglobin 12.1 12.0 - 15.0 g/dL   HCT 35.9 (L) 36.0 - 46.0 %   MCV 84.7 78.0 - 100.0 fL   MCH 28.5 26.0 - 34.0 pg   MCHC 33.7 30.0 - 36.0 g/dL   RDW 13.6 11.5 - 15.5 %   Platelets 231 150 - 400 K/uL  Glucose, capillary     Status: Abnormal   Collection Time: 08/06/14  8:45 AM  Result Value Ref Range   Glucose-Capillary 144 (H) 70 - 99 mg/dL   Comment 1 Documented in Chart    Comment 2 Notify RN   Glucose,  capillary     Status: Abnormal   Collection Time: 08/06/14 12:00 PM  Result Value Ref Range   Glucose-Capillary 116 (H) 70 - 99 mg/dL   Comment 1 Documented in Chart    Comment 2 Notify RN    Labs are reviewed and are pertinent for medical issues being treated.  Current Facility-Administered Medications  Medication Dose Route Frequency Provider Last Rate Last Dose  . 0.9 %  sodium chloride infusion   Intravenous Continuous Theodis Blaze, MD 75 mL/hr at 08/06/14 1325    . aspirin chewable tablet 81 mg  81 mg Oral Daily Etta Quill, DO   81 mg at 08/06/14 1005  . busPIRone (BUSPAR) tablet 10 mg  10 mg Oral Daily Etta Quill, DO   10 mg at 08/06/14 1005  . carbamazepine (TEGRETOL) tablet 200 mg  200 mg Oral Daily Etta Quill, DO   200 mg at 08/06/14 1006  . clindamycin (CLEOCIN) IVPB 600 mg  600 mg Intravenous 3 times per day Theodis Blaze, MD   600 mg at 08/06/14 2252  . clonazePAM (KLONOPIN) tablet 0.5 mg  0.5 mg Oral TID PRN Etta Quill, DO      . heparin injection 5,000 Units  5,000 Units Subcutaneous 3 times per day Etta Quill, DO   5,000 Units at 08/06/14 2252  . Influenza vac split quadrivalent PF  (FLUARIX) injection 0.5 mL  0.5 mL Intramuscular Tomorrow-1000 Theodis Blaze, MD      . insulin aspart (novoLOG) injection 0-15 Units  0-15 Units Subcutaneous TID WC Etta Quill, DO   3 Units at 08/06/14 1633  . nicotine (NICODERM CQ - dosed in mg/24 hours) patch 21 mg  21 mg Transdermal Daily Theodis Blaze, MD   21 mg at 08/06/14 1954  . oxyCODONE-acetaminophen (PERCOCET/ROXICET) 5-325 MG per tablet 1-2 tablet  1-2 tablet Oral Q3H PRN Theodis Blaze, MD   2 tablet at 08/06/14 2340  . pneumococcal 23 valent vaccine (PNU-IMMUNE) injection 0.5 mL  0.5 mL Intramuscular Tomorrow-1000 Theodis Blaze, MD      . pravastatin (PRAVACHOL) tablet 40 mg  40 mg Oral Daily Etta Quill, DO   40 mg at 08/06/14 1006  . pregabalin (LYRICA) capsule 100 mg  100 mg Oral BID Etta Quill, DO   100 mg at 08/06/14 2252  . sodium chloride 0.9 % injection 3 mL  3 mL Intravenous Q12H Etta Quill, DO   3 mL at 08/06/14 2252    Psychiatric Specialty Exam:     Blood pressure 124/90, pulse 87, temperature 98.2 F (36.8 C), temperature source Oral, resp. rate 18, height 5' 6"  (1.676 m), weight 194 lb 14.2 oz (88.4 kg), SpO2 100 %.Body mass index is 31.47 kg/(m^2).  General Appearance: Casual  Eye Contact::  Good  Speech:  Normal Rate  Volume:  Normal  Mood:  Depressed, situational, mild  Affect:  Blunt  Thought Process:  Coherent  Orientation:  Full (Time, Place, and Person)  Thought Content:  WDL  Suicidal Thoughts:  No  Homicidal Thoughts:  No  Memory:  Immediate;   Good Recent;   Good Remote;   Good  Judgement:  Fair  Insight:  Fair  Psychomotor Activity:  Normal  Concentration:  Good  Recall:  Good  Fund of Knowledge:Good  Language: Good  Akathisia:  No  Handed:  Right  AIMS (if indicated):     Assets:  Communication Skills Desire  for Improvement Financial Resources/Insurance Housing Intimacy Leisure Time Resilience Social Support Transportation  Sleep:       Musculoskeletal: Strength & Muscle Tone: within normal limits Gait & Station: unsteady Patient leans: N/A  Treatment Plan Summary:   Recommend changing Klonopin 0.5 mg TID PRN anxiety to BID, follow-up with her regular providers at [facility] for psychiatric care after discharge.  Dr. Sabra Heck reviewed the patient and concurs with the plan.  Waylan Boga, [place] 08/07/2014 1:02 AM  I have been consulted about this patient and agree with the assessment and plan Geralyn Flash A. [email or web address].D.

## 2014-08-07 NOTE — Discharge Summary (Signed)
Physician Discharge Summary  Allison [name] ZOX:[id number] DOB: 1956-04-27 DOA: 08/04/2014  PCP: Nadean Corwin, MD  Admit date: 08/04/2014 Discharge date: 08/07/2014  Recommendations for Outpatient Follow-up:  1. Pt will need to follow up with PCP in 2-3 weeks post discharge 2. Please obtain BMP to evaluate electrolytes and kidney function 3. Please also check CBC to evaluate Hg and Hct levels 4. Lisinopril-HCTZ and Mobic stopped due to acute renal insufficiency, please restart when appropriate 5. Pt started on Hydralazine instead for BP control 6. Pt d/c on Clindamycin for foot cellulitis, 10 more days post discharge 7. Ortho team recommended follow up with Dr. Lajoyce Corners, appointment scheduled and placed in AVS   Discharge Diagnoses:  Principal Problem:   Acute kidney failure Active Problems:   T2_NIDDM   Hypotension   Overdose   Lisfranc dislocation   Foot fracture, right   Foot fracture, left   Acute kidney injury   Heroin abuse   Drug overdose   Discharge Condition: Stable  Diet recommendation: Heart healthy diet discussed in details    Brief narrative: 58 y.o. female with h/o HTN, DM2, heroin use, presented to ED with R foot pain after an episode of fall one day prior to this admission. Foot is now more swollen and she explains she can not put any weight on her right foot. In ED, she was lethargic and was given one dose of Narcan IV after which lethargy resolved and she was admitted to SDU for further evaluation.   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 Sherlean Foot - recommends splint for now and weight bearing as tolerated - follow up with Dr. Lajoyce Corners in an outpatient setting   Foot fracture, left - metatarsals - no interventions required per ortho team - analgesia as needed     Code Status: Full Family Communication: Pt at bedside   IV Access:    Peripheral IV Procedures and diagnostic studies:    Ct Head Wo Contrast 08/05/2014 Unremarkable noncontrast CT of the head.   Dg Foot Complete Left 08/05/2014 Intraarticular fracture at the base of the second metatarsal. 2. Cannot exclude fracture at the base of the third metatarsal.   Dg Foot Complete Right 08/05/2014 Lisfranc fracture/dislocation, with lateral displacement of the second through fifth metatarsals. Mildly comminuted fracture at the base of the second metatarsal, and apparent fractures of the bases of the third, fourth and fifth metatarsals, with a small avulsion fragment also seen at the base of the fifth metatarsal. 2. Bipartite medial sesamoid of the first toe.  Medical Consultants:    Psych  Other Consultants:    None  Anti-Infectives:    Doxycycline 12/27 --> 12/28  Clindamycin 12/28 -->   Discharge Exam: Filed Vitals:   08/07/14 0505  BP: 128/86  Pulse: 78  Temp: 97.8 F (36.6 C)  Resp:    Filed Vitals:   08/06/14 1600 08/06/14 1807 08/06/14 2114 08/07/14 0505  BP:  119/75 124/90 128/86  Pulse:  88 87 78  Temp: 98.7 F (37.1 C) 98 F (36.7 C) 98.2 F (36.8 C) 97.8 F (36.6 C)  TempSrc: Oral Oral Oral Oral   Resp:  14 18   Height:      Weight:      SpO2:  100% 100% 98%    General: Pt is alert, follows commands appropriately, not in acute distress Cardiovascular: Regular rate and rhythm, S1/S2 +, no murmurs, no rubs, no gallops Respiratory: Clear to auscultation bilaterally, no wheezing, no crackles, no rhonchi Abdominal: Soft, non tender, non distended, bowel sounds +, no guarding Extremities: no cyanosis, pulses palpable bilaterally DP and PT Neuro: Grossly nonfocal  Discharge Instructions  Discharge Instructions    Diet - low sodium heart healthy    Complete by:  As directed      Increase activity slowly    Complete by:  As directed             Medication List    STOP taking these medications        ALPRAZolam 0.5 MG tablet  Commonly known as:  XANAX     cyclobenzaprine 10 MG tablet  Commonly known as:  FLEXERIL     lisinopril-hydrochlorothiazide 20-12.5 MG per tablet  Commonly known as:  PRINZIDE,ZESTORETIC     meloxicam 15 MG tablet  Commonly known as:  MOBIC      TAKE these medications        aspirin 81 MG chewable tablet  Chew 81 mg by mouth daily.     busPIRone 10 MG tablet  Commonly known as:  BUSPAR  10 mg daily.     carbamazepine 200 MG tablet  Commonly known as:  TEGRETOL  Take 200 mg by mouth daily.     clindamycin 300 MG capsule  Commonly known as:  CLEOCIN  Take 1 capsule (300 mg total) by mouth 3 (three) times daily.     clonazePAM 0.5 MG tablet  Commonly known as:  KLONOPIN  Take 1 tablet (0.5 mg total) by mouth 2 (two) times daily as needed for anxiety.     glipiZIDE 10 MG tablet  Commonly known as:  GLUCOTROL  Take 5 mg by mouth 3 (three) times daily before meals.     hydrALAZINE 10 MG tablet  Commonly known as:  APRESOLINE  Take 1 tablet (10 mg total) by mouth 3 (three) times daily.     LYRICA 100 MG capsule  Generic drug:  pregabalin  TAKE ONE CAPSULE BY MOUTH TWICE DAILY     metFORMIN 500 MG 24 hr tablet  Commonly known as:   GLUCOPHAGE-XR  TAKE ONE TABLET BY MOUTH WITH BREAKFAST AND LUNCH AND TWO TABLETS BY MOUTH WITH SUPPER FOR DIABETES     oxyCODONE-acetaminophen 5-325 MG per tablet  Commonly known as:  PERCOCET/ROXICET  Take 1-2 tablets by mouth every 3 (three) hours as needed for moderate pain.     pravastatin 40 MG tablet  Commonly known as:  PRAVACHOL  Take 40 mg by mouth daily.     QUEtiapine 50 MG Tb24 24 hr tablet  Commonly known as:  SEROQUEL XR  Take 50 mg by mouth daily.     VITAMIN D  PO  Take 8,000 Units by mouth daily.           Follow-up Information    Follow up with Nadean Corwin, MD.   Specialty:  Internal Medicine   Contact information:   [address] [place] [address] [phone]       Follow up with Debbora Presto, MD.   Specialty:  Internal Medicine   Why:  As needed, If symptoms worsen   Contact information:   [address] [place] [address] [phone]       Follow up with Nadara Mustard, MD.   Specialty:  Orthopedic Surgery   Contact information:   [address] [place] [address] [phone]        The results of significant diagnostics from this hospitalization (including imaging, microbiology, ancillary and laboratory) are listed below for reference.     Microbiology: Recent Results (from the past 240 hour(s))  MRSA PCR Screening     Status: None   Collection Time: 08/05/14  2:59 AM  Result Value Ref Range Status   MRSA by PCR NEGATIVE NEGATIVE Final    Comment:        The GeneXpert MRSA Assay (FDA approved for NASAL specimens only), is one component of a comprehensive MRSA colonization surveillance program. It is not intended to diagnose MRSA infection nor to guide or monitor treatment for MRSA infections.   Culture, blood (routine x 2)     Status: None (Preliminary result)   Collection Time: 08/06/14  1:45 PM  Result Value Ref Range Status   Specimen Description BLOOD  RIGHT ARM  Final   Special Requests BOTTLES DRAWN AEROBIC AND ANAEROBIC 10CC  Final   Culture   Final           BLOOD CULTURE RECEIVED NO GROWTH TO DATE CULTURE WILL BE HELD FOR 5 DAYS BEFORE ISSUING A FINAL NEGATIVE REPORT Performed at [facility]    Report Status PENDING  Incomplete  Culture, blood (routine x 2)     Status: None (Preliminary result)   Collection Time: 08/06/14  1:50 PM  Result Value Ref Range Status   Specimen Description BLOOD RIGHT HAND  Final   Special Requests BOTTLES DRAWN AEROBIC AND ANAEROBIC 10CC  Final   Culture   Final           BLOOD CULTURE RECEIVED NO GROWTH TO DATE CULTURE WILL BE HELD FOR 5 DAYS BEFORE ISSUING A FINAL NEGATIVE REPORT Performed at [facility]    Report Status PENDING  Incomplete     Labs: Basic Metabolic Panel:  Recent Labs Lab 07/31/14 1119 08/04/14 2347 08/05/14 0001 08/05/14 0528 08/05/14 1230 08/06/14 0352 08/07/14 0458  NA 133* 133*  --  139 142 137 136  K 3.7 3.0*  --  2.8* 3.5 3.3* 3.8  CL 93* 98  --  110 109 107 106  CO2 29 28  --  26 26 23 25   GLUCOSE 450* 297*  --  177* 178* 154* 169*  BUN 22 34*  --  29* 23 19 18   CREATININE 2.00* 3.21*  --  2.38* 1.94* 1.39* 1.30*  CALCIUM 9.5 8.0*  --  7.5* 8.3* 8.5 8.3*  MG 2.1  --  2.1  --   --   --   --    Liver Function Tests:  Recent Labs Lab 07/31/14 1119 08/05/14 1230 08/06/14 0352  AST 37 34 29  ALT 52* 33 31  ALKPHOS 96 78 86  BILITOT 0.5 0.5 0.8  PROT 6.5 5.8* 6.3  ALBUMIN 3.8 3.0* 3.1*   CBC:  Recent Labs Lab 07/31/14 1119 08/04/14 2347 08/05/14 0528 08/06/14 0352 08/07/14 0458  WBC 8.1 11.5* 9.3 12.2* 8.7  NEUTROABS 4.1 7.7  --   --   --   HGB 14.5 12.0 11.3* 12.1 11.2*  HCT 43.9 35.2* 34.3* 35.9* 34.3*  MCV 87.5 85.2 86.0 84.7 85.3  PLT 272 228 204 231 218   CBG:  Recent Labs Lab 08/06/14 0845 08/06/14 1200 08/06/14 1625 08/06/14 2228 08/07/14 0733  GLUCAP 144* 116* 154* 168* 144*     SIGNED: Time  coordinating discharge: Over 30 minutes  Debbora PrestoMAGICK-Valoria Tamburri, MD  [facility] 08/07/2014, 8:43 AM Pager [phone]  If 7PM-7AM, please contact night-coverage [email or web address] Password [email or web address]

## 2014-08-07 NOTE — Progress Notes (Signed)
Clinical Social Work Department CLINICAL SOCIAL WORK PSYCHIATRY SERVICE LINE ASSESSMENT 08/07/2014  Patient:  Allison [name]  Account:  [id number]  [place] Date:  08/04/2014  Clinical Social Worker:  Sindy Messing, LCSW  Date/Time:  08/07/2014 11:00 AM Referred by:  Physician  Date referred:  08/07/2014 Reason for Referral  Psychosocial assessment   Presenting Symptoms/Problems (In the person's/family's own words):   Psych consulted due to heroin abuse.   Abuse/Neglect/Trauma History (check all that apply)  Denies history   Abuse/Neglect/Trauma Comments:   Psychiatric History (check all that apply)  Outpatient treatment   Psychiatric medications:  Buspar 10 mg  Xanax 0.5 mg  Klonopin 0.5 mg  Seroquel 50 mg   Current Mental Health Hospitalizations/Previous Mental Health History:   Patient reports she has been diagnosed with Bipolar affective disorder and follows up at [facility] for medication management. Patient reports that she is not currently involved with any counseling or support groups.   Current provider:   Jodene Nam and Date:   [place], [place]   Current Medications:   Scheduled Meds:      . aspirin  81 mg Oral Daily  . busPIRone  10 mg Oral Daily  . carbamazepine  200 mg Oral Daily  . clindamycin (CLEOCIN) IV  600 mg Intravenous 3 times per day  . heparin  5,000 Units Subcutaneous 3 times per day  . insulin aspart  0-15 Units Subcutaneous TID WC  . nicotine  21 mg Transdermal Daily  . pravastatin  40 mg Oral Daily  . pregabalin  100 mg Oral BID  . sodium chloride  3 mL Intravenous Q12H        Continuous Infusions:      . sodium chloride 75 mL/hr at 08/07/14 0405          PRN Meds:.clonazePAM, oxyCODONE-acetaminophen       Previous Impatient Admission/Date/Reason:   [facility] in 2007 and 2000.   Emotional Health / Current Symptoms    Suicide/Self Harm  None reported   Suicide attempt in the past:   Patient denies any SI or HI.   Other harmful behavior:    None reported   Psychotic/Dissociative Symptoms  None reported   Other Psychotic/Dissociative Symptoms:    Attention/Behavioral Symptoms  Within Normal Limits   Other Attention / Behavioral Symptoms:   Patient engaged during assessment.    Cognitive Impairment  Within Normal Limits   Other Cognitive Impairment:   Patient alert and oriented.    Mood and Adjustment  Flat    Stress, Anxiety, Trauma, Any Recent Loss/Stressor  None reported   Anxiety (frequency):   N/A   Phobia (specify):   N/A   Compulsive behavior (specify):   N/A   Obsessive behavior (specify):   N/A   Other:   N/A   Substance Abuse/Use  Current substance use   SBIRT completed (please refer for detailed history):  Y  Self-reported substance use:   Patient reports she used heroin prior to admission. Patient reports she has only used a couple of times but knows it is not healthy and plans to stop using. Patient does not believe she needs SA treatment or counseling. Patient denies any alcohol use.   Urinary Drug Screen Completed:  Y Alcohol level:   <5    Environmental/Housing/Living Arrangement  Stable housing   Who is in the home:   Son   Emergency contact:  Larry-husband   Financial  Medicare   Patient's Strengths and Goals (patient's own words):  Patient reports supportive family and that she is compliant with outpatient follow up.   Clinical Social Worker's Interpretive Summary:   CSW received referral to complete psychosocial assessment. CSW reviewed chart and met with patient at bedside. CSW introduced myself and explained role.    Patient reports she lives at home with her son and husband. Patient has three sons but two sons are currently incarcerated. Patient reports she misses her sons but is able to talk with them via phone. Patient reports that her son that lives with her is supportive but has [place] concerns as well. Patient and son both follow up at [facility].     Patient denies any overdose prior to admission. Patient reports she takes her medication as prescribed and uses Seroquel to assist her with sleeping. Patient reports she is upset that someone put in her chart that she was trying to overdose because she does not feel suicidal and has no plans to harm herself.    CSW inquired about patient's substance use. Patient guarded and reluctant to talk about use because she reports she has already told other medical staff about her consumption. Patient completed SBIRT but scored low and declined any resources. Patient reports she is anxious to DC and does not need any assistance from CSW.    Patient has scheduled appointment with [facility] in March for medication management and declined any further assistance with therapy or support groups.    CSW is signing off but available if needed.   Disposition:  Outpatient referral made/needed   [place], [place] [phone]

## 2014-08-07 NOTE — Progress Notes (Signed)
Dr Lajoyce Cornersuda office called and appt scheduled for Jan 8, 2016n at 930am. Patient informed she needs to call office back with insurance info.

## 2014-08-08 ENCOUNTER — Other Ambulatory Visit: Payer: Self-pay | Admitting: Internal Medicine

## 2014-08-12 LAB — CULTURE, BLOOD (ROUTINE X 2)
CULTURE: NO GROWTH
Culture: NO GROWTH

## 2014-08-17 ENCOUNTER — Other Ambulatory Visit: Payer: Self-pay | Admitting: Physician Assistant

## 2014-08-21 ENCOUNTER — Other Ambulatory Visit: Payer: Self-pay | Admitting: Physician Assistant

## 2014-08-24 ENCOUNTER — Emergency Department (HOSPITAL_COMMUNITY)
Admission: EM | Admit: 2014-08-24 | Discharge: 2014-08-25 | Disposition: A | Discharge status: Home / Self Care | Payer: Commercial Managed Care - HMO | Attending: Emergency Medicine | Admitting: Emergency Medicine

## 2014-08-24 ENCOUNTER — Encounter (HOSPITAL_COMMUNITY): Payer: Self-pay | Admitting: Emergency Medicine

## 2014-08-24 ENCOUNTER — Emergency Department (HOSPITAL_COMMUNITY): Payer: Commercial Managed Care - HMO

## 2014-08-24 DIAGNOSIS — Y9289 Other specified places as the place of occurrence of the external cause: Secondary | ICD-10-CM | POA: Diagnosis not present

## 2014-08-24 DIAGNOSIS — Z720 Tobacco use: Secondary | ICD-10-CM | POA: Insufficient documentation

## 2014-08-24 DIAGNOSIS — M549 Dorsalgia, unspecified: Secondary | ICD-10-CM

## 2014-08-24 DIAGNOSIS — S3992XA Unspecified injury of lower back, initial encounter: Secondary | ICD-10-CM | POA: Insufficient documentation

## 2014-08-24 DIAGNOSIS — Z79899 Other long term (current) drug therapy: Secondary | ICD-10-CM | POA: Insufficient documentation

## 2014-08-24 DIAGNOSIS — E1122 Type 2 diabetes mellitus with diabetic chronic kidney disease: Secondary | ICD-10-CM | POA: Insufficient documentation

## 2014-08-24 DIAGNOSIS — W19XXXA Unspecified fall, initial encounter: Secondary | ICD-10-CM

## 2014-08-24 DIAGNOSIS — R4182 Altered mental status, unspecified: Secondary | ICD-10-CM

## 2014-08-24 DIAGNOSIS — I129 Hypertensive chronic kidney disease with stage 1 through stage 4 chronic kidney disease, or unspecified chronic kidney disease: Secondary | ICD-10-CM | POA: Diagnosis not present

## 2014-08-24 DIAGNOSIS — W1839XA Other fall on same level, initial encounter: Secondary | ICD-10-CM | POA: Diagnosis not present

## 2014-08-24 DIAGNOSIS — Y998 Other external cause status: Secondary | ICD-10-CM | POA: Diagnosis not present

## 2014-08-24 DIAGNOSIS — E785 Hyperlipidemia, unspecified: Secondary | ICD-10-CM | POA: Insufficient documentation

## 2014-08-24 DIAGNOSIS — Y9389 Activity, other specified: Secondary | ICD-10-CM | POA: Insufficient documentation

## 2014-08-24 DIAGNOSIS — G8929 Other chronic pain: Secondary | ICD-10-CM | POA: Insufficient documentation

## 2014-08-24 DIAGNOSIS — Z7982 Long term (current) use of aspirin: Secondary | ICD-10-CM | POA: Diagnosis not present

## 2014-08-24 DIAGNOSIS — M1990 Unspecified osteoarthritis, unspecified site: Secondary | ICD-10-CM | POA: Insufficient documentation

## 2014-08-24 DIAGNOSIS — N182 Chronic kidney disease, stage 2 (mild): Secondary | ICD-10-CM | POA: Insufficient documentation

## 2014-08-24 DIAGNOSIS — Z8659 Personal history of other mental and behavioral disorders: Secondary | ICD-10-CM | POA: Diagnosis not present

## 2014-08-24 DIAGNOSIS — S99921A Unspecified injury of right foot, initial encounter: Secondary | ICD-10-CM | POA: Diagnosis not present

## 2014-08-24 DIAGNOSIS — Z792 Long term (current) use of antibiotics: Secondary | ICD-10-CM | POA: Diagnosis not present

## 2014-08-24 LAB — CBC WITH DIFFERENTIAL/PLATELET
Basophils Absolute: 0.1 10*3/uL (ref 0.0–0.1)
Basophils Relative: 0 % (ref 0–1)
EOS ABS: 0.5 10*3/uL (ref 0.0–0.7)
Eosinophils Relative: 4 % (ref 0–5)
HCT: 38.1 % (ref 36.0–46.0)
Hemoglobin: 12.9 g/dL (ref 12.0–15.0)
Lymphocytes Relative: 16 % (ref 12–46)
Lymphs Abs: 2.1 10*3/uL (ref 0.7–4.0)
MCH: 28.9 pg (ref 26.0–34.0)
MCHC: 33.9 g/dL (ref 30.0–36.0)
MCV: 85.2 fL (ref 78.0–100.0)
MONO ABS: 1.4 10*3/uL — AB (ref 0.1–1.0)
Monocytes Relative: 10 % (ref 3–12)
NEUTROS PCT: 70 % (ref 43–77)
Neutro Abs: 9.3 10*3/uL — ABNORMAL HIGH (ref 1.7–7.7)
Platelets: 308 10*3/uL (ref 150–400)
RBC: 4.47 MIL/uL (ref 3.87–5.11)
RDW: 14.6 % (ref 11.5–15.5)
WBC: 13.3 10*3/uL — ABNORMAL HIGH (ref 4.0–10.5)

## 2014-08-24 LAB — COMPREHENSIVE METABOLIC PANEL
ALBUMIN: 3.6 g/dL (ref 3.5–5.2)
ALT: 28 U/L (ref 0–35)
AST: 26 U/L (ref 0–37)
Alkaline Phosphatase: 102 U/L (ref 39–117)
Anion gap: 11 (ref 5–15)
BILIRUBIN TOTAL: 0.6 mg/dL (ref 0.3–1.2)
BUN: 28 mg/dL — ABNORMAL HIGH (ref 6–23)
CHLORIDE: 96 meq/L (ref 96–112)
CO2: 29 mmol/L (ref 19–32)
Calcium: 9.8 mg/dL (ref 8.4–10.5)
Creatinine, Ser: 4.02 mg/dL — ABNORMAL HIGH (ref 0.50–1.10)
GFR calc non Af Amer: 11 mL/min — ABNORMAL LOW (ref >90)
GFR, EST AFRICAN AMERICAN: 13 mL/min — AB (ref >90)
GLUCOSE: 155 mg/dL — AB (ref 70–99)
POTASSIUM: 3.3 mmol/L — AB (ref 3.5–5.1)
Sodium: 136 mmol/L (ref 135–145)
Total Protein: 7.2 g/dL (ref 6.0–8.3)

## 2014-08-24 LAB — CBG MONITORING, ED: Glucose-Capillary: 148 mg/dL — ABNORMAL HIGH (ref 70–99)

## 2014-08-24 LAB — TROPONIN I: Troponin I: <0.03 ng/mL (ref <0.031)

## 2014-08-24 MED ORDER — SODIUM CHLORIDE 0.9 % IV BOLUS (SEPSIS)
2000.0000 mL | Freq: Once | INTRAVENOUS | Status: AC
Start: 2014-08-24 — End: 2014-08-25
  Administered 2014-08-24: 2000 mL via INTRAVENOUS

## 2014-08-24 MED ORDER — KETOROLAC TROMETHAMINE 60 MG/2ML IM SOLN
60.0000 mg | Freq: Once | INTRAMUSCULAR | Status: AC
  Administered 2014-08-24: 60 mg via INTRAMUSCULAR
  Filled 2014-08-24: qty 2

## 2014-08-24 MED ORDER — SODIUM CHLORIDE 0.9 % IV BOLUS (SEPSIS)
1000.0000 mL | Freq: Once | INTRAVENOUS | Status: AC
  Administered 2014-08-24: 1000 mL via INTRAVENOUS

## 2014-08-24 MED ORDER — NALOXONE HCL 0.4 MG/ML IJ SOLN
0.4000 mg | Freq: Once | INTRAMUSCULAR | Status: DC
  Filled 2014-08-24: qty 1

## 2014-08-24 NOTE — ED Notes (Signed)
Heard pt scream, went in the room to check on pt.  Pt was found on the floor.  Pt reports she hit her head on the stretcher chair wheel.  Pt yelled "I told you I can't walk!"  Asked pt what she was doing when she fell.  She states she got up to use her phone, after she said that she could not walk.  Moon boots noted on RLE.  Gelene MinkShari EDPA and Cheri RN at bedside.  Pt assisted back in her chair and instructed pt to stay in bed.

## 2014-08-24 NOTE — ED Notes (Signed)
Bed: WTR7 Expected date:  Expected time:  Means of arrival:  Comments: EMS back pain

## 2014-08-24 NOTE — ED Notes (Signed)
MD at bedside. Dr. Harrison at bedside.  

## 2014-08-24 NOTE — ED Provider Notes (Signed)
CSN: [id number]     Arrival date & time 08/24/14  2051 History   This chart was scribed for non-physician practitioner Elpidio Anis, PA-C, working with Purvis Sheffield, MD by Evon Slack, ED Scribe. This patient was seen in room WTR7/WTR7 and the patient's care was started at 9:11 PM.     Chief Complaint  Patient presents with  . Back Pain  . Foot Pain   Patient is a 59 y.o. female presenting with back pain and lower extremity pain. The history is provided by the patient. No language interpreter was used.  Back Pain Associated symptoms: no abdominal pain, no chest pain, no fever, no headaches and no weakness   Associated symptoms comment:  She presents via EMS for management of persistent/chronic back pain. She reports recent hospitalization that included care of a "fracture in both feet" treated with CAM walker on right foot. She was discharged home 08/07/14. Tonight she reports increased pain in back after fall at home yesterday. Foot Pain Pertinent negatives include no chest pain, no abdominal pain, no headaches and no shortness of breath.   HPI Comments: Allison [name] is a 59 y.o. female who presents to the Emergency Department complaining of right foot pain onset tonight. Pt states she did fall today PTA. Pt states she feels as if she re injured her right foot and is having mid back pain. Pt states she is having some severe pain in her right shoulder.   Pt states she fractured both feet 08/04/14. Pt states was suppose to follow up for her fractures but decided not follow up due to not being able to afford the visit. Denies any recent IV drug use.   Past Medical History  Diagnosis Date  . Hypertension   . Hyperlipidemia   . Chronic lumbar pain   . 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 Generalized osteoarthrosis, involving multiple sites   . Acute drug-induced gout of right ankle   . Acute gout due to renal impairment involving ankle   . 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 Substance use disorder     heroin   Past Surgical History  Procedure Laterality Date  . Abdominal hysterectomy  1979  . Oophorectomy  1979  . Breast surgery Bilateral 1986    gel implants    No family history on file. History  Substance Use Topics  . Smoking status: Current Every Day Smoker -- 1.50 packs/day    Types: Cigarettes  . Smokeless tobacco: Not on file  . Alcohol Use: No   OB History    No data available     Review of Systems  Constitutional: Negative for fever.  Respiratory: Negative for shortness of breath.   Cardiovascular: Negative for chest pain.  Gastrointestinal: Negative for nausea, vomiting and abdominal pain.  Musculoskeletal: Positive for back pain and arthralgias.  Skin: Negative for wound.  Neurological: Negative for weakness and headaches.  All other systems reviewed and are negative.     Allergies  Review of patient's allergies indicates no known allergies.  Home Medications   Prior to Admission medications   Medication Sig Start Date End Date Taking? Authorizing Provider  aspirin 81 MG chewable tablet Chew 81 mg by mouth daily.     Historical Provider, MD  busPIRone (BUSPAR) 10 MG tablet 10 mg daily.  05/11/14   Historical Provider, MD  carbamazepine (TEGRETOL) 200 MG tablet Take 200 mg by mouth daily.  08/02/13  Historical Provider, MD  Cholecalciferol (VITAMIN D PO) Take 8,000 Units by mouth daily.     Historical Provider, MD  clindamycin (CLEOCIN) 300 MG capsule Take 1 capsule (300 mg total) by mouth 3 (three) times daily. 08/07/14   Dorothea OgleIskra M Myers, MD  clonazePAM (KLONOPIN) 0.5 MG tablet Take 1 tablet (0.5 mg total) by mouth 2 (two) times daily as needed for anxiety. 08/07/14   Dorothea OgleIskra M Myers, MD  glipiZIDE (GLUCOTROL) 10 MG tablet Take 5 mg by mouth 3 (three) times daily before meals.    Historical Provider, MD  hydrALAZINE (APRESOLINE) 10 MG tablet Take 1 tablet (10 mg total) by mouth  3 (three) times daily. 08/07/14   Dorothea OgleIskra M Myers, MD  LYRICA 100 MG capsule TAKE ONE CAPSULE BY MOUTH TWICE DAILY 07/18/14   Quentin MullingAmanda Collier, PA-C  metFORMIN (GLUCOPHAGE-XR) 500 MG 24 hr tablet TAKE ONE TABLET BY MOUTH ONCE DAILY WITH BREAKFAST AND LUNCH, THEN TAKE TWO TABLETS BY MOUTH WITH SUPPER FOR DIABETES 08/08/14   Lucky CowboyWilliam McKeown, MD  oxyCODONE-acetaminophen (PERCOCET/ROXICET) 5-325 MG per tablet Take 1-2 tablets by mouth every 3 (three) hours as needed for moderate pain. 08/07/14   Dorothea OgleIskra M Myers, MD  pravastatin (PRAVACHOL) 40 MG tablet Take 40 mg by mouth daily.    Historical Provider, MD  QUEtiapine (SEROQUEL XR) 50 MG TB24 24 hr tablet Take 50 mg by mouth daily.    Historical Provider, MD   BP 96/58 mmHg  Pulse 113  Temp(Src) 97.9 F (36.6 C) (Oral)  Resp 15  SpO2 98%   Physical Exam  Constitutional: She is oriented to person, place, and time. She appears well-developed and well-nourished. No distress.  HENT:  Head: Normocephalic and atraumatic.  Eyes: Conjunctivae and EOM are normal.  Neck: Neck supple. No tracheal deviation present.  Cardiovascular: Normal rate.   Pulmonary/Chest: Effort normal. No respiratory distress.  Abdominal: Soft. There is no tenderness.  Musculoskeletal: Normal range of motion.  Neurological: She is alert and oriented to person, place, and time.  Skin: Skin is warm and dry.  Psychiatric: She has a normal mood and affect. Her behavior is normal.  Nursing note and vitals reviewed.   ED Course  Procedures (including critical care time) DIAGNOSTIC STUDIES: Oxygen Saturation is 98% on RA, normal by my interpretation.    COORDINATION OF CARE:    Labs Review Labs Reviewed - No data to display  Imaging Review No results found.   EKG Interpretation None      MDM   Final diagnoses:  Fall    While in FT Rm 7, the patient attempted to ambulate and fell to the floor. No difficulty getting patient up to the chair with assistance, no obvious  discomfort with lifting, standing to her feet and sitting back in chair.   She denies further heroin abuse since discharge. She states that she was contacted by the orthopedist office but declined appointment for follow up care of foot fractures. She states what brought her in tonight was uncontrolled back pain. IM Toradol provided. Feel strongly that narcotic treatment is contraindicated for this patient who is a fall risk and has a history of substance abuse, including overdose.  10:45 - patient found sleeping in room. Blood pressure found to be significantly low (77/45) with manual pressure of 68 systolic. She wakes easily, seems less agitated/restless than on arrival. IV bolus, additional lab studies ordered. Moved to resus room, 2nd line started. Dr. Romeo AppleHarrison has been in to evaluate patient. Narcan ordered, however,  pressure improved - order discontinued.  12:30 - Patient continues to improve. Resting comfortably, blood pressure remains at mid-90's systolic. Patient awake, calm, appears comfortable.   3:30 - no change.   5:00 - Patient awake, alert, calm, VSS. She wants to go home but states she won't have a ride until her son is awake. Will continue to observe until 6 or 6:30 when she can call her son to come get her.   I personally performed the services described in this documentation, which was scribed in my presence. The recorded information has been reviewed and is accurate.       Arnoldo Hooker, PA-C 08/24/14 2232  Arnoldo Hooker, PA-C 08/25/14 1610  Purvis Sheffield, MD 08/25/14 [phone]

## 2014-08-24 NOTE — Discharge Instructions (Signed)
Chronic Back Pain ° When back pain lasts longer than 3 months, it is called chronic back pain. People with chronic back pain often go through certain periods that are more intense (flare-ups).  °CAUSES °Chronic back pain can be caused by wear and tear (degeneration) on different structures in your back. These structures include: °· The bones of your spine (vertebrae) and the joints surrounding your spinal cord and nerve roots (facets). °· The strong, fibrous tissues that connect your vertebrae (ligaments). °Degeneration of these structures may result in pressure on your nerves. This can lead to constant pain. °HOME CARE INSTRUCTIONS °· Avoid bending, heavy lifting, prolonged sitting, and activities which make the problem worse. °· Take brief periods of rest throughout the day to reduce your pain. Lying down or standing usually is better than sitting while you are resting. °· Take over-the-counter or prescription medicines only as directed by your caregiver. °SEEK IMMEDIATE MEDICAL CARE IF:  °· You have weakness or numbness in one of your legs or feet. °· You have trouble controlling your bladder or bowels. °· You have nausea, vomiting, abdominal pain, shortness of breath, or fainting. °Document Released: 09/03/2004 Document Revised: 10/19/2011 Document Reviewed: 07/11/2011 °[employer]® Patient Information ©2015 [employer]. This information is not intended to replace advice given to you by your health care provider. Make sure you discuss any questions you have with your health care provider. ° °Back Pain, Adult °Low back pain is very common. About 1 in 5 people have back pain. The cause of low back pain is rarely dangerous. The pain often gets better over time. About half of people with a sudden onset of back pain feel better in just 2 weeks. About 8 in 10 people feel better by 6 weeks.  °CAUSES °Some common causes of back pain include: °· Strain of the muscles or ligaments supporting the spine. °· Wear and tear  (degeneration) of the spinal discs. °· Arthritis. °· Direct injury to the back. °DIAGNOSIS °Most of the time, the direct cause of low back pain is not known. However, back pain can be treated effectively even when the exact cause of the pain is unknown. Answering your caregiver's questions about your overall health and symptoms is one of the most accurate ways to make sure the cause of your pain is not dangerous. If your caregiver needs more information, he or she may order lab work or imaging tests (X-rays or MRIs). However, even if imaging tests show changes in your back, this usually does not require surgery. °HOME CARE INSTRUCTIONS °For many people, back pain returns. Since low back pain is rarely dangerous, it is often a condition that people can learn to manage on their own.  °· Remain active. It is stressful on the back to sit or stand in one place. Do not sit, drive, or stand in one place for more than 30 minutes at a time. Take short walks on level surfaces as soon as pain allows. Try to increase the length of time you walk each day. °· Do not stay in bed. Resting more than 1 or 2 days can delay your recovery. °· Do not avoid exercise or work. Your body is made to move. It is not dangerous to be active, even though your back may hurt. Your back will likely heal faster if you return to being active before your pain is gone. °· Pay attention to your body when you  bend and lift. Many people have less discomfort when lifting if they bend their knees, keep the load close to their bodies, and avoid twisting. Often, the most comfortable   positions are those that put less stress on your recovering back. °· Find a comfortable position to sleep. Use a firm mattress and lie on your side with your knees slightly bent. If you lie on your back, put a pillow under your knees. °· Only take over-the-counter or prescription medicines as directed by your caregiver. Over-the-counter medicines to reduce pain and inflammation  are often the most helpful. Your caregiver may prescribe muscle relaxant drugs. These medicines help dull your pain so you can more quickly return to your normal activities and healthy exercise. °· Put ice on the injured area. °¨ Put ice in a plastic bag. °¨ Place a towel between your skin and the bag. °¨ Leave the ice on for 15-20 minutes, 03-04 times a day for the first 2 to 3 days. After that, ice and heat may be alternated to reduce pain and spasms. °· Ask your caregiver about trying back exercises and gentle massage. This may be of some benefit. °· Avoid feeling anxious or stressed. Stress increases muscle tension and can worsen back pain. It is important to recognize when you are anxious or stressed and learn ways to manage it. Exercise is a great option. °SEEK MEDICAL CARE IF: °· You have pain that is not relieved with rest or medicine. °· You have pain that does not improve in 1 week. °· You have new symptoms. °· You are generally not feeling well. °SEEK IMMEDIATE MEDICAL CARE IF:  °· You have pain that radiates from your back into your legs. °· You develop new bowel or bladder control problems. °· You have unusual weakness or numbness in your arms or legs. °· You develop nausea or vomiting. °· You develop abdominal pain. °· You feel faint. °Document Released: 07/27/2005 Document Revised: 01/26/2012 Document Reviewed: 11/28/2013 °[employer]® Patient Information ©2015 [employer]. This information is not intended to replace advice given to you by your health care provider. Make sure you discuss any questions you have with your health care provider. ° °

## 2014-08-24 NOTE — ED Notes (Signed)
Pt BIB EMS. Pt fell on 08/04/14 and broke both feet. Pt wearing bilateral ortho boots. States she fell today and now has thoracic pain and L foot pain. EMS notes no deformity, bruising or swelling. Pt alert, no acute distress.

## 2014-08-25 LAB — RAPID URINE DRUG SCREEN, HOSP PERFORMED
AMPHETAMINES: NOT DETECTED
BENZODIAZEPINES: NOT DETECTED
Barbiturates: NOT DETECTED
Cocaine: NOT DETECTED
Opiates: POSITIVE — AB
Tetrahydrocannabinol: NOT DETECTED

## 2014-08-25 LAB — URINALYSIS, ROUTINE W REFLEX MICROSCOPIC
Bilirubin Urine: NEGATIVE
Glucose, UA: 500 mg/dL — AB
Ketones, ur: NEGATIVE mg/dL
Nitrite: NEGATIVE
PH: 5.5 (ref 5.0–8.0)
Protein, ur: 30 mg/dL — AB
Specific Gravity, Urine: 1.007 (ref 1.005–1.030)
Urobilinogen, UA: 0.2 mg/dL (ref 0.0–1.0)

## 2014-08-25 LAB — URINE MICROSCOPIC-ADD ON

## 2014-08-25 NOTE — ED Notes (Signed)
Pt is waiting in hall bed for ride. Pt provided with coffee

## 2014-08-25 NOTE — ED Notes (Signed)
Patient removed blood pressure cuff and pulse ox and states she doesn't want them on.  Explained to patient that we need to monitor her blood pressure because of her low blood pressure on arrival, patient declines to have it replaced

## 2014-08-25 NOTE — ED Notes (Signed)
Pt provided with breakfast tray. Pt is waiting on son to pick her up who is en route

## 2014-08-25 NOTE — ED Notes (Signed)
Pt's son, Reuel BoomDaniel, called at [phone] and voicemail left asking for him to come and pick up his mother for discharge

## 2014-08-25 NOTE — ED Notes (Addendum)
Pt left lime green jacket in hall bed. Jacket placed into belongings bag with label and placed into locker 39

## 2014-08-25 NOTE — ED Notes (Signed)
Pt moved to hall bed. Is on her cell phone giving someone directions to this facility. Pt is agitated and cursing at times, but is not loud or disruptive.

## 2014-10-09 DEATH — deceased

## 2014-11-06 ENCOUNTER — Encounter: Payer: Self-pay | Admitting: Internal Medicine

## 2015-10-15 IMAGING — CR DG ANKLE COMPLETE 3+V*R*
4 series · 4 of 4 positions shown · non-contrast
Comparison: No prior ankle imaging. Right foot x-rays 08/04/2014
are correlated.

CLINICAL DATA: Fell while in the triage portion of the hospital,
injuring the right ankle. Initial encounter.

EXAM:
RIGHT ANKLE - COMPLETE 3+ VIEW

[x ankle lat right]
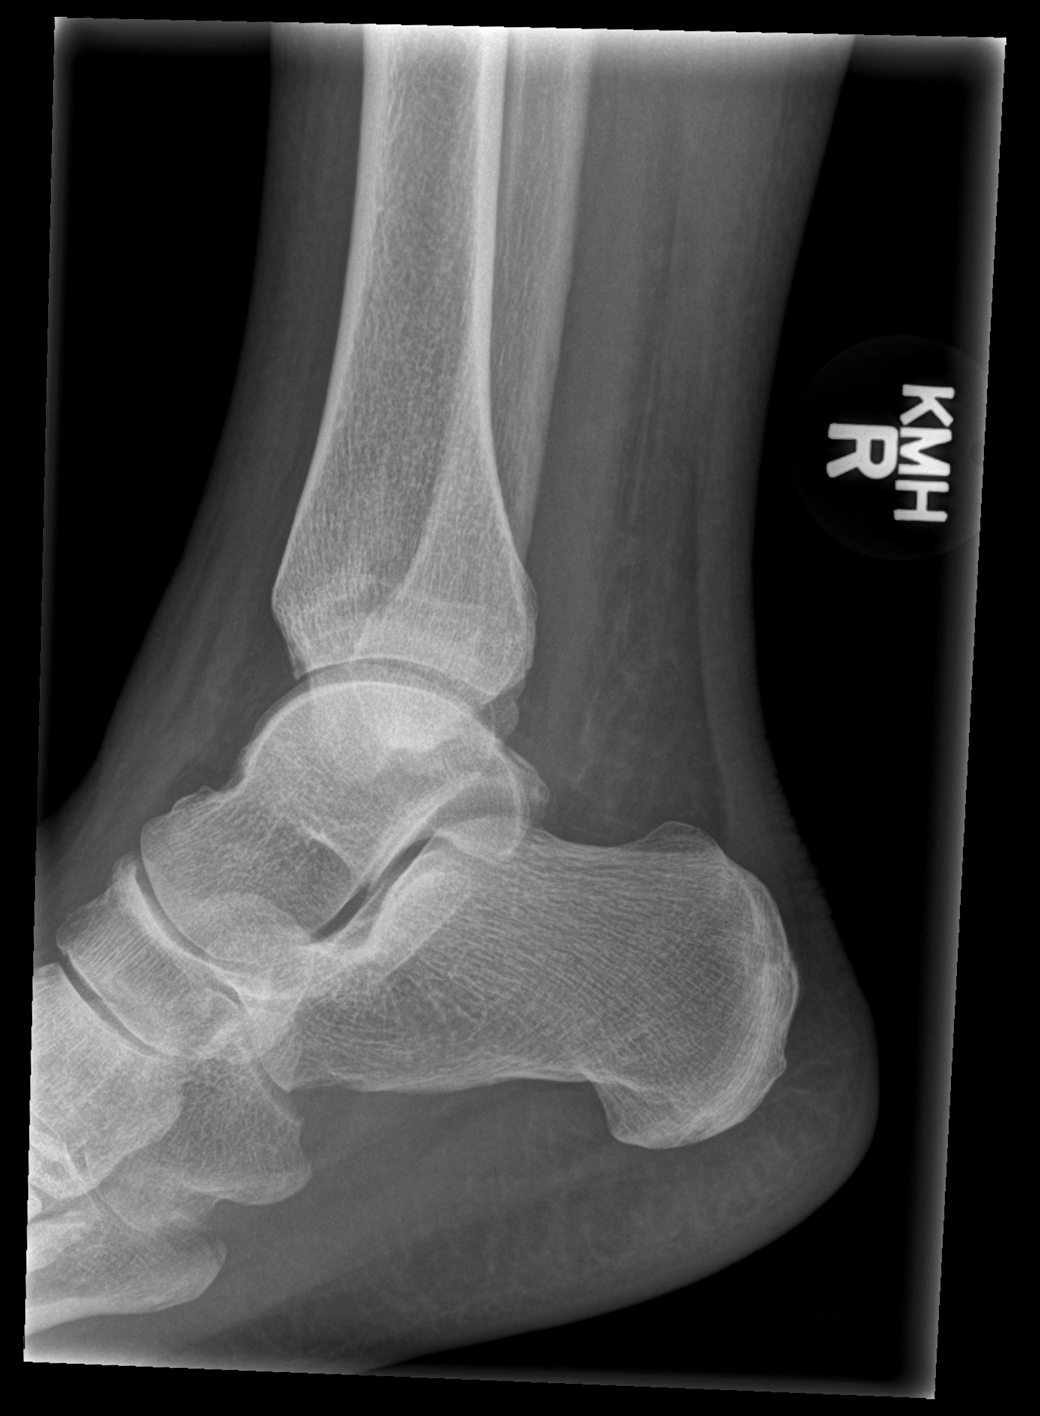

[x ankle ap right]
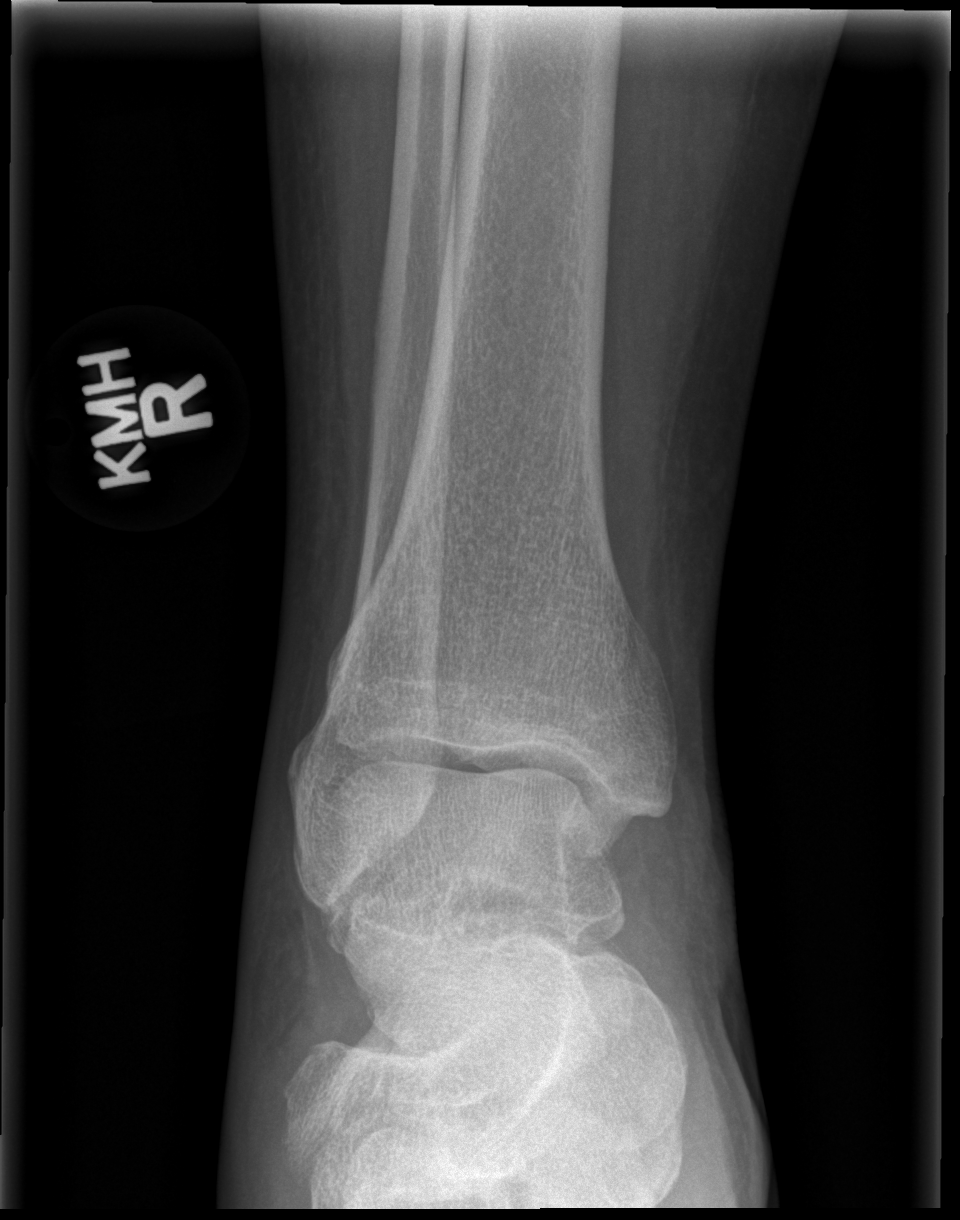

[x ankle obl right (1 of 2)]
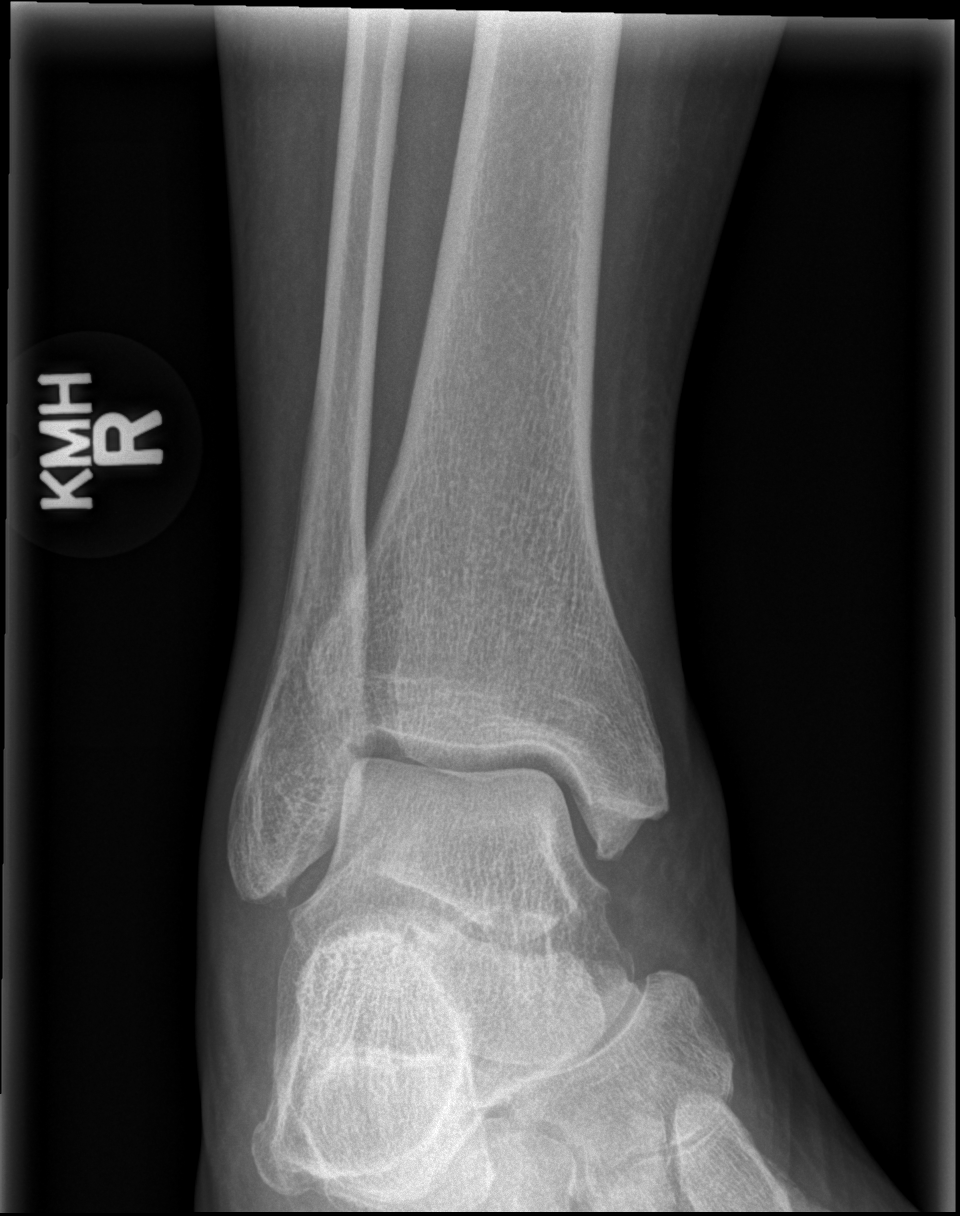

[x ankle obl right (2 of 2)]
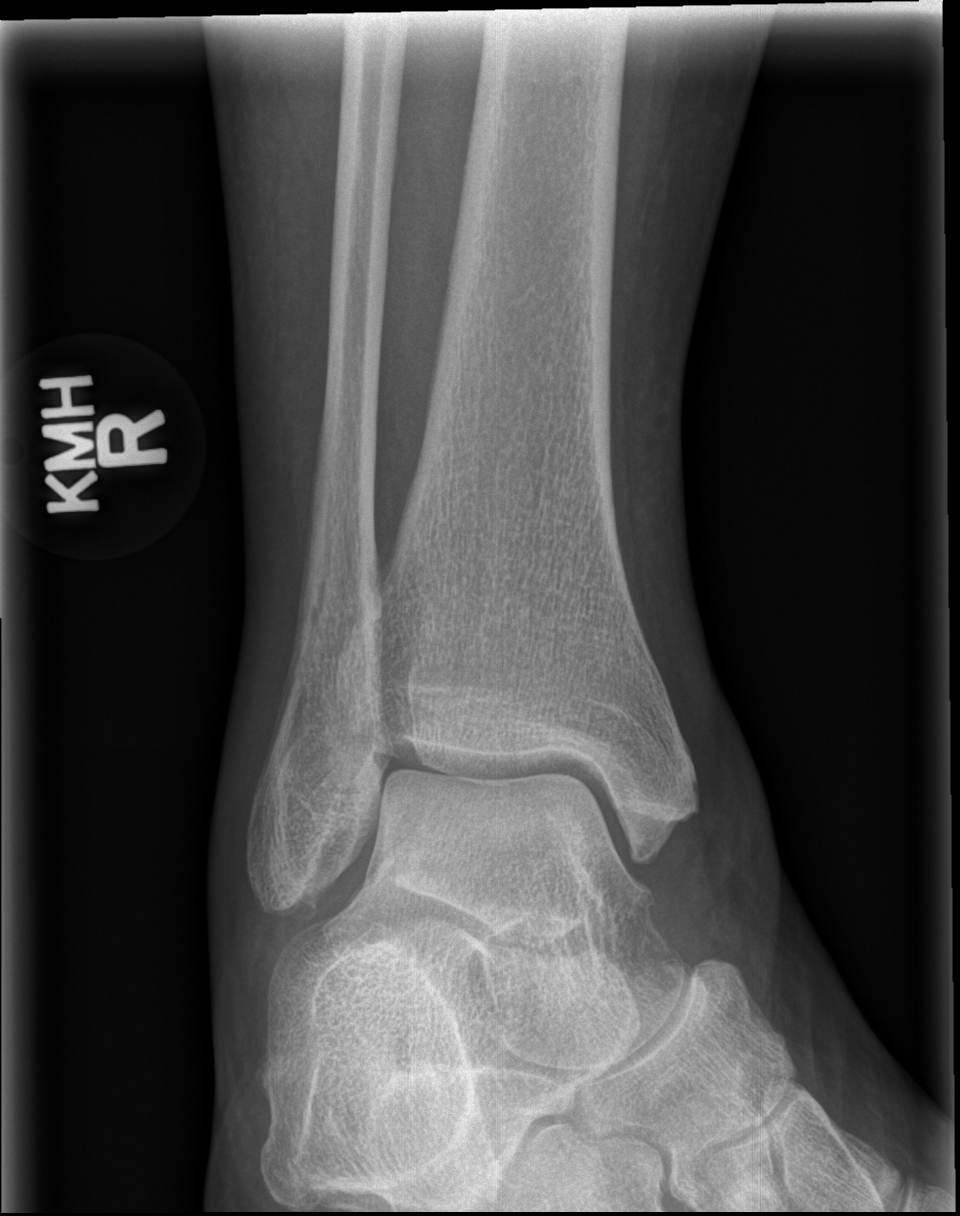

[4 of 4 positions shown; findings below may reference images not displayed]

FINDINGS: No evidence of acute fracture involving the bones that make up the
ankle joint. Nondisplaced fracture involving the base of the 5th
metacarpal as noted on the prior foot imaging. Ankle mortise intact
with well-preserved joint space. Well-preserved bone mineral
density. No intrinsic osseous abnormalities. No visible joint
effusion.
IMPRESSION: Normal right ankle. Nondisplaced fracture involving the base of 5th
metatarsal as noted on the prior right foot imaging.

## 2015-10-15 IMAGING — CT CT HEAD W/O CM
2 series · 16 of 30 positions shown, 19 images · non-contrast
Comparison: Prior study from 08/05/2014.

CLINICAL DATA: Initial evaluation for altered mental status, fall.

EXAM:
CT HEAD WITHOUT CONTRAST
TECHNIQUE: Contiguous axial images were obtained from the base of the skull
through the vertex without intravenous contrast.

[Series 2: head w/o · axial · non-contrast · 0.41mm/px · z∈[-133,-13]mm · 9 of 32 slices shown, 12 images]
[im 4/32  brain]
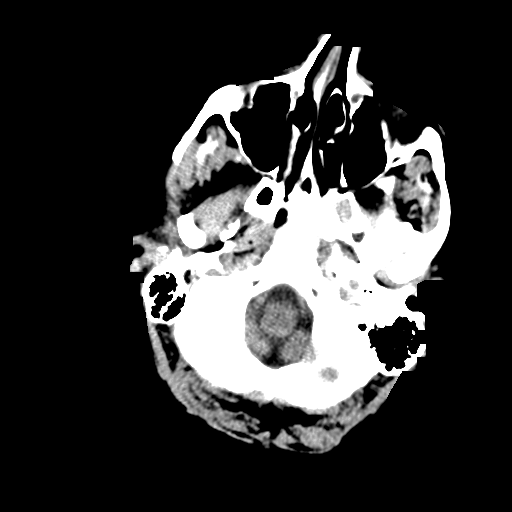
[im 4/32  bone]
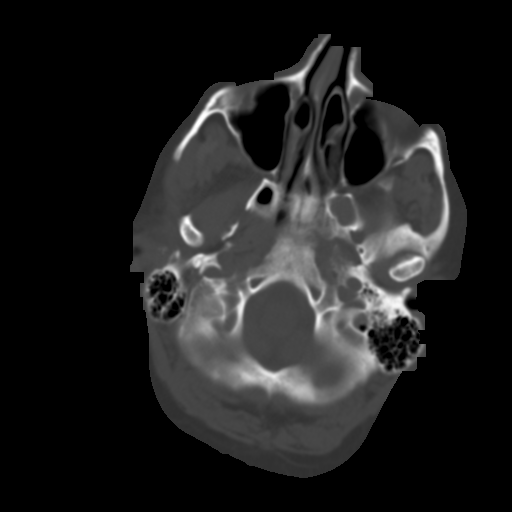
[im 7/32  brain]
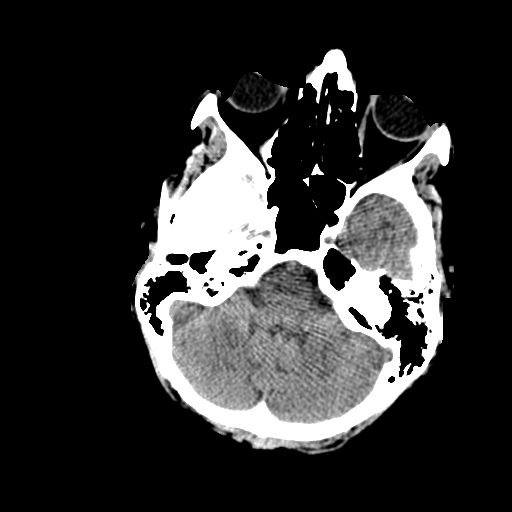
[im 10/32  brain]
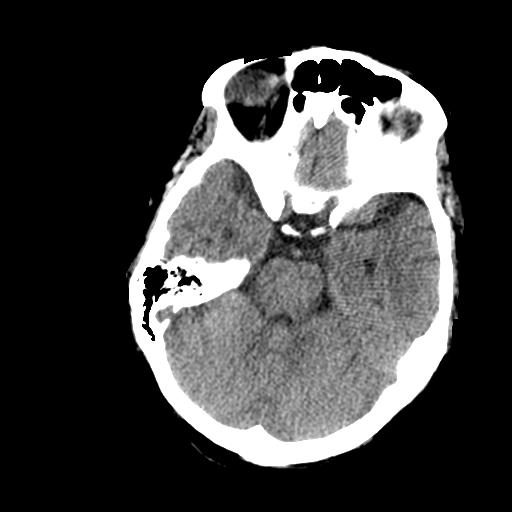
[im 13/32  brain]
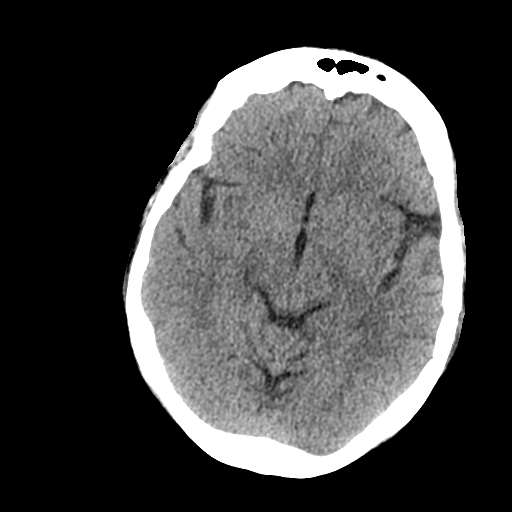
[im 16/32  brain]
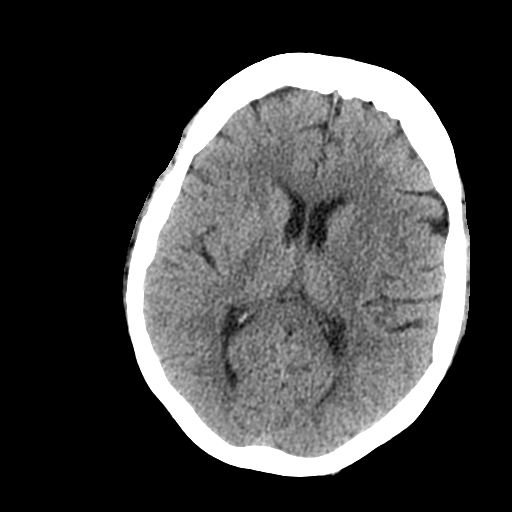
[im 16/32  bone]
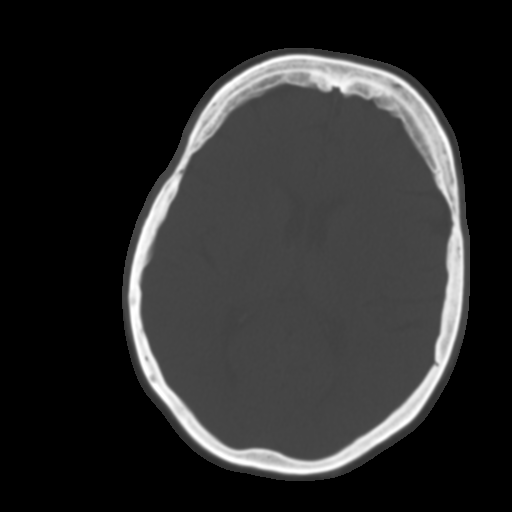
[im 19/32  brain]
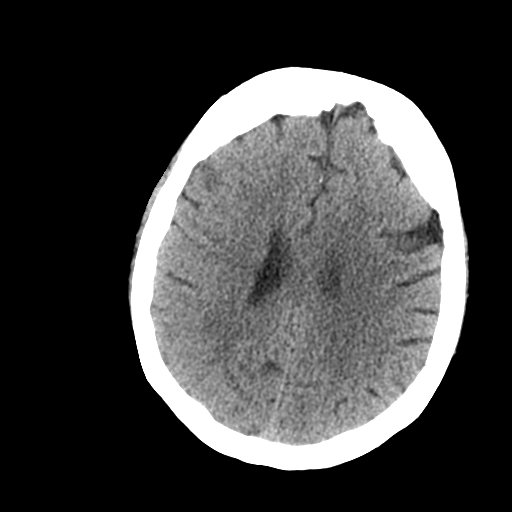
[im 22/32  brain]
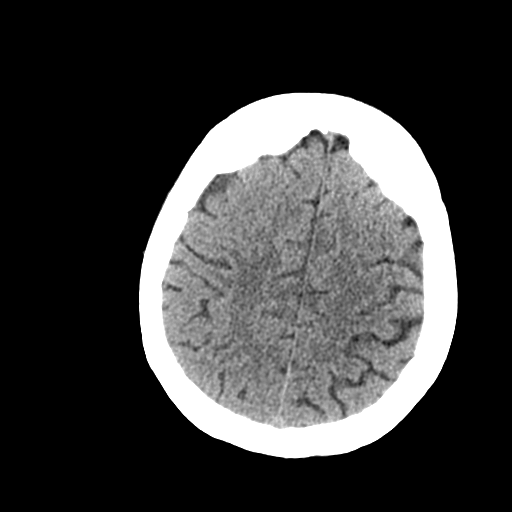
[im 25/32  brain]
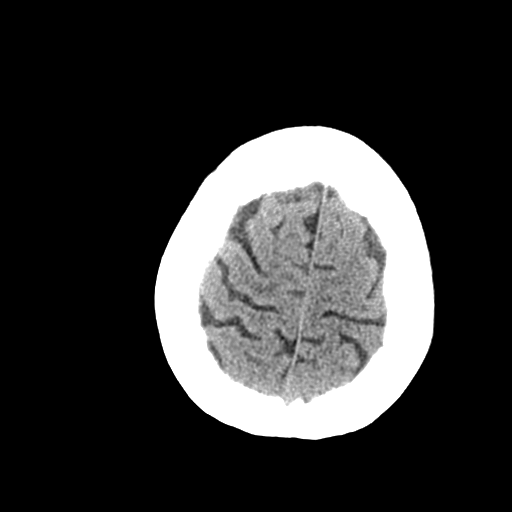
[im 28/32  brain]
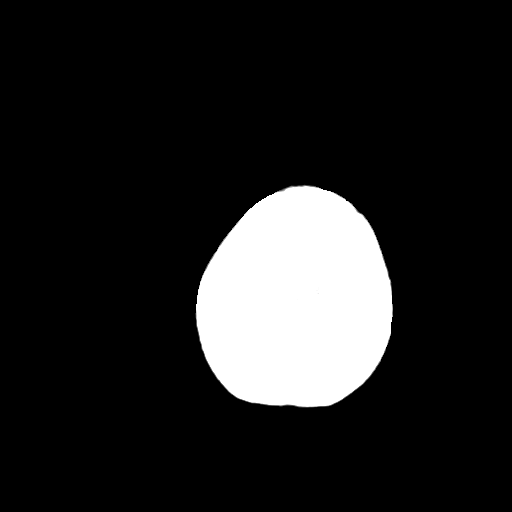
[im 28/32  bone]
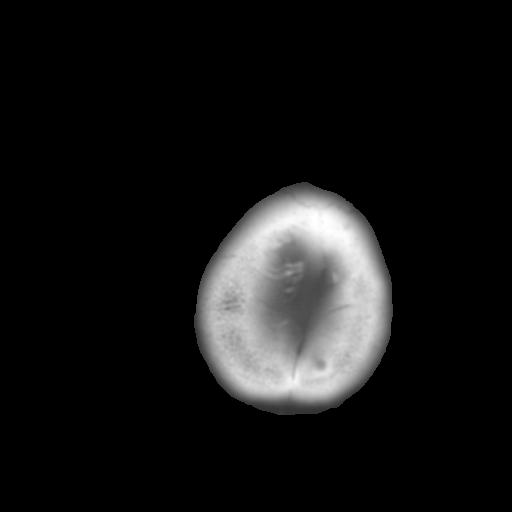

[Series 3: bone windows · axial · 0.41mm/px · z∈[-133,-31]mm · 7 of 52 slices shown]
[im 6/52  bone]
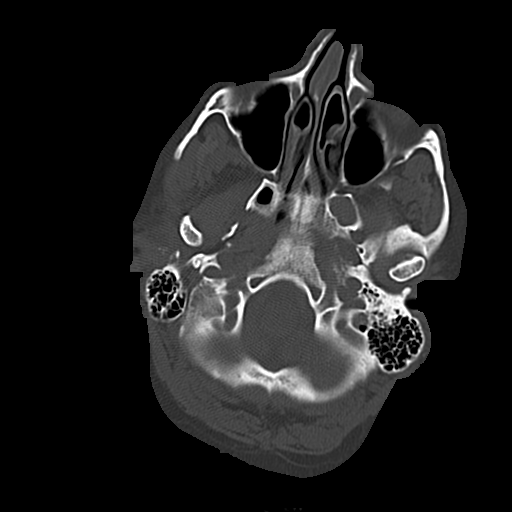
[im 12/52  bone]
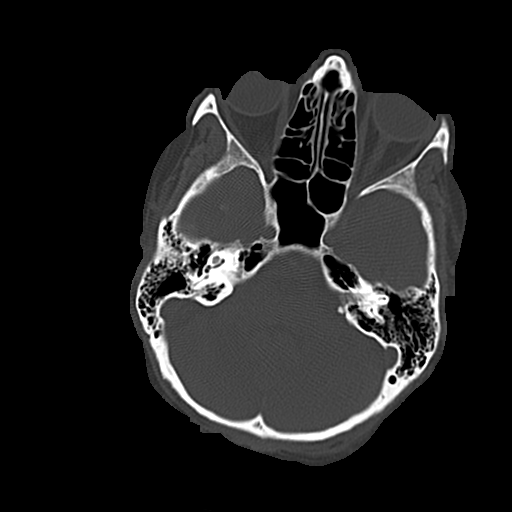
[im 18/52  bone]
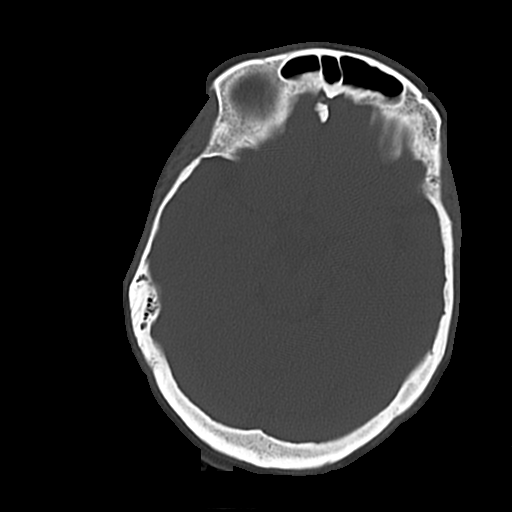
[im 23/52  bone]
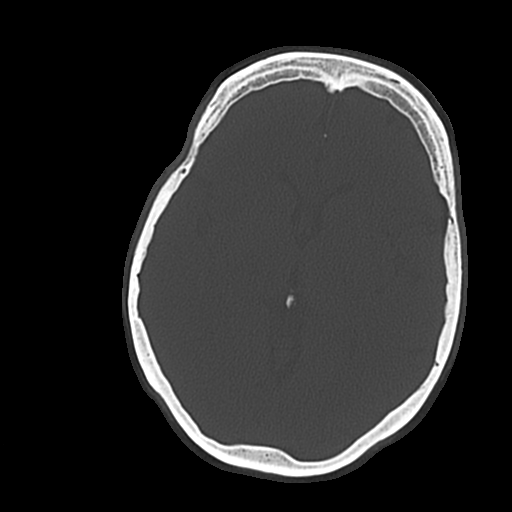
[im 29/52  bone]
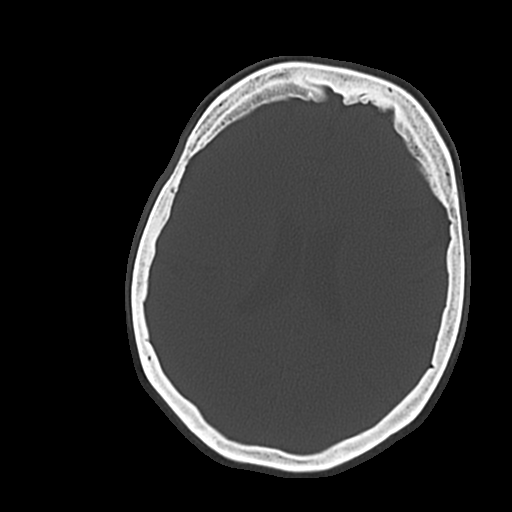
[im 35/52  bone]
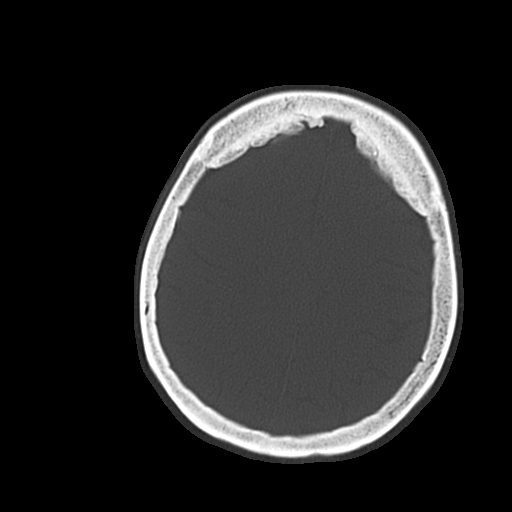
[im 40/52  bone]
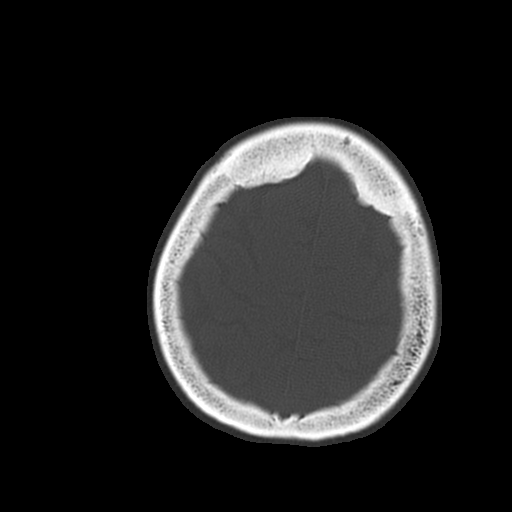

[16 of 30 positions shown; findings below may reference images not displayed]

FINDINGS: Patchy hypodensity within the periventricular and deep white matter
both cerebral hemispheres most consistent with chronic small vessel
ischemic disease.

There is no acute intracranial hemorrhage or infarct. No mass lesion
or midline shift. Gray-white matter differentiation is well
maintained. Ventricles are normal in size without evidence of
hydrocephalus. CSF containing spaces are within normal limits. No
extra-axial fluid collection.

The calvarium is intact.

Orbital soft tissues are within normal limits.

Mild opacity noted within the right sphenoid sinus. Lateral recess
of the left sphenoid sinus is opacified. Paranasal sinuses are
otherwise clear. No mastoid effusion.

Scalp soft tissues are unremarkable.
IMPRESSION: 1. No acute intracranial process.
2. Mild chronic microvascular ischemic changes.
